# Patient Record
Sex: Female | Born: 1955
Health system: Southern US, Community
[De-identification: ages and names within clinical notes are randomized; demographics above are authoritative.]

## PROBLEM LIST (undated history)

## (undated) DIAGNOSIS — R7303 Prediabetes: Secondary | ICD-10-CM

## (undated) DIAGNOSIS — R87619 Unspecified abnormal cytological findings in specimens from cervix uteri: Secondary | ICD-10-CM

## (undated) DIAGNOSIS — N760 Acute vaginitis: Secondary | ICD-10-CM

## (undated) DIAGNOSIS — E7800 Pure hypercholesterolemia, unspecified: Secondary | ICD-10-CM

## (undated) DIAGNOSIS — N950 Postmenopausal bleeding: Secondary | ICD-10-CM

## (undated) DIAGNOSIS — IMO0002 Reserved for concepts with insufficient information to code with codable children: Secondary | ICD-10-CM

## (undated) DIAGNOSIS — B9689 Other specified bacterial agents as the cause of diseases classified elsewhere: Secondary | ICD-10-CM

## (undated) HISTORY — DX: Reserved for concepts with insufficient information to code with codable children: IMO0002

## (undated) HISTORY — DX: Other specified bacterial agents as the cause of diseases classified elsewhere: B96.89

## (undated) HISTORY — DX: Postmenopausal bleeding: N95.0

## (undated) HISTORY — DX: Acute vaginitis: N76.0

## (undated) HISTORY — DX: Unspecified abnormal cytological findings in specimens from cervix uteri: R87.619

## (undated) HISTORY — DX: Prediabetes: R73.03

---

## 1999-04-29 ENCOUNTER — Emergency Department (HOSPITAL_COMMUNITY): Admission: EM | Admit: 1999-04-29 | Discharge: 1999-04-29 | Payer: Self-pay | Admitting: Emergency Medicine

## 2000-03-10 ENCOUNTER — Other Ambulatory Visit: Admission: RE | Admit: 2000-03-10 | Discharge: 2000-03-10 | Payer: Self-pay | Admitting: Obstetrics and Gynecology

## 2000-03-11 ENCOUNTER — Encounter: Payer: Self-pay | Admitting: Obstetrics and Gynecology

## 2000-03-11 ENCOUNTER — Encounter: Admission: RE | Admit: 2000-03-11 | Discharge: 2000-03-11 | Payer: Self-pay | Admitting: Obstetrics and Gynecology

## 2001-03-14 ENCOUNTER — Encounter: Admission: RE | Admit: 2001-03-14 | Discharge: 2001-03-14 | Payer: Self-pay | Admitting: Obstetrics and Gynecology

## 2001-03-14 ENCOUNTER — Encounter: Payer: Self-pay | Admitting: Obstetrics and Gynecology

## 2002-04-04 ENCOUNTER — Encounter: Payer: Self-pay | Admitting: Obstetrics and Gynecology

## 2002-04-04 ENCOUNTER — Encounter: Admission: RE | Admit: 2002-04-04 | Discharge: 2002-04-04 | Payer: Self-pay | Admitting: Obstetrics and Gynecology

## 2005-12-05 ENCOUNTER — Emergency Department (HOSPITAL_COMMUNITY): Admission: AD | Admit: 2005-12-05 | Discharge: 2005-12-05 | Payer: Self-pay | Admitting: Family Medicine

## 2010-06-07 ENCOUNTER — Encounter: Payer: Self-pay | Admitting: Obstetrics and Gynecology

## 2011-12-14 ENCOUNTER — Telehealth: Payer: Self-pay | Admitting: Obstetrics and Gynecology

## 2011-12-14 NOTE — Telephone Encounter (Signed)
Tc to pt per telephone call. Pt c/o vaginal bleeding mainly with wiping only starting 12/10/11-present. No fever or abd pain. Pt wants to know if this is normal due to dx of menopause in 05/2011. Informed pt of vph recs per 07/07/2011 for hysteroscopy if bldg continues, pt declines at this time. Pt wants vph to let her know if she feels hysteroscopy is necessary first before proceeding. Pt is willing to have irregular bleeding on/off if this is common.  Will consult with vph and cb with recs. Pt agrees

## 2011-12-14 NOTE — Telephone Encounter (Signed)
Lm on vm to cb per telephone call.  

## 2011-12-14 NOTE — Telephone Encounter (Signed)
[name]/chart received

## 2011-12-15 NOTE — Telephone Encounter (Signed)
[name]/TRIAGE-FOLLOW UP CALL

## 2011-12-16 ENCOUNTER — Telehealth: Payer: Self-pay

## 2011-12-16 NOTE — Telephone Encounter (Signed)
Pt c/o heavy bleeding this am . Pt is having to wear to 2 pads with a tampon;changing ever 1 hours. No abd pain. Mild cramping. No fever.

## 2011-12-16 NOTE — Telephone Encounter (Signed)
Tc to pt per vph recs rgdg heavy bldg. Appt sched 12/17/11 @ 1:45 for eval. Pt agrees.

## 2011-12-17 ENCOUNTER — Ambulatory Visit (INDEPENDENT_AMBULATORY_CARE_PROVIDER_SITE_OTHER): Payer: BC Managed Care – PPO | Admitting: Obstetrics and Gynecology

## 2011-12-17 ENCOUNTER — Encounter: Payer: Self-pay | Admitting: Obstetrics and Gynecology

## 2011-12-17 VITALS — BP 132/68 | HR 72 | Temp 99.2°F

## 2011-12-17 DIAGNOSIS — N920 Excessive and frequent menstruation with regular cycle: Secondary | ICD-10-CM | POA: Insufficient documentation

## 2011-12-17 MED ORDER — MISOPROSTOL 200 MCG PO TABS: ORAL_TABLET | ORAL | Status: DC

## 2011-12-17 MED ORDER — MEDROXYPROGESTERONE ACETATE 10 MG PO TABS: ORAL_TABLET | ORAL | Status: DC

## 2011-12-17 NOTE — Progress Notes (Signed)
GYN PROBLEM VISIT  Subjective: [name] is a 56 y.o. year old female,G2P2000, who presents for a problem visit. Pt c/o vaginal bleeding mainly with wiping only starting 12/10/11-present. No fever or abd pain. Mild cramping. Pt wants to know if this is normal due to dx of menopause in 05/2011. Pt c/o increased bldg since yesterday. Pt having to wear pads and tampons for protection, and change hourly.  Objective:  BP 132/68  Pulse 72  Temp 99.2 F (37.3 C)  Normal endo bx in March   External genitalia: normal general appearance Vaginal: large amt blood in vault Cervix: normal appearance Adnexa: no masses Uterus: upper limits nl size  Assessment: Perimenopausal bleeding pattern Prior successful management with Mirena Probable continued need for contraception  Plan: Options for management discussed: A)Hysterectomy      B)Provera short term until bleeding stops then observation      C)Mirena      D)Contraceptive hormones such as BCPs or implants or injections Pt wants B) then C) Provera 40mg  daily until bleeding stops then taper to maintain amenorrhea,then insert Mirena when she returns to [place] Return to office prn for Mirena insertion.   Carol Gray,   12/17/2011 2:54 PM

## 2011-12-17 NOTE — Telephone Encounter (Signed)
Make follow up appointment

## 2011-12-17 NOTE — Telephone Encounter (Signed)
Appt sched today @1 :45 with vph for eval. Pt agrees.

## 2012-03-29 ENCOUNTER — Ambulatory Visit: Payer: BC Managed Care – PPO | Admitting: Obstetrics and Gynecology

## 2012-04-13 ENCOUNTER — Encounter: Payer: Self-pay | Admitting: Obstetrics and Gynecology

## 2012-04-13 ENCOUNTER — Ambulatory Visit (INDEPENDENT_AMBULATORY_CARE_PROVIDER_SITE_OTHER): Payer: BC Managed Care – PPO | Admitting: Obstetrics and Gynecology

## 2012-04-13 VITALS — BP 108/78 | HR 64 | Ht 71.0 in | Wt 223.0 lb

## 2012-04-13 DIAGNOSIS — N924 Excessive bleeding in the premenopausal period: Secondary | ICD-10-CM

## 2012-04-13 DIAGNOSIS — Z01419 Encounter for gynecological examination (general) (routine) without abnormal findings: Secondary | ICD-10-CM

## 2012-04-13 NOTE — Progress Notes (Deleted)
Subjective:    Carol Gray is a 56 y.o. female G2P2000 who presents for annual exam.  The patient has no complaints today.   Last Pap: *** WNL: {yes no free text:20080::"Yes"} Regular Periods:{yes no:314532} Contraception: ***  Monthly Breast exam:{yes no:314532} Tetanus<27yrs:{yes no:314532} Nl.Bladder Function:{yes no:314532} Daily BMs:{yes no:314532} Healthy Diet:{yes no:314532} Calcium:{yes no:314532} Mammogram:{yes no:314532} Date of Mammogram: *** Exercise:{YES NO:22349} Have often Exercise: *** Seatbelt: {yes no:314532} Abuse at home: {yes no:314532} Stressful work:{yes RU:045409} Sigmoid-colonoscopy: *** Bone Density: {EXAM; YES/NO:19492::"No"} PCP: *** Change in PMH: *** Change in [facility]:***  The following portions of the patient's history were reviewed and updated as appropriate: allergies, current medications, past family history, past medical history, past social history, past surgical history and problem list.  Review of Systems {ros; complete:30496} Gastrointestinal:No change in bowel habits, no abdominal pain, no rectal bleeding Genitourinary:negative for dysuria, frequency, hematuria, nocturia and urinary incontinence    Objective:     BP 108/78  Pulse 64  Ht 5\' 11"  (1.803 m)  Wt 223 lb (101.152 kg)  BMI 31.10 kg/m2  Weight:  Wt Readings from Last 1 Encounters:  04/13/12 223 lb (101.152 kg)     BMI: Body mass index is 31.10 kg/(m^2). General Appearance: Alert, appropriate appearance for age. No acute distress HEENT: Grossly normal Neck / Thyroid: Supple, no masses, nodes or enlargement Lungs: clear to auscultation bilaterally Back: No CVA tenderness Breast Exam: {exam; breast:30839::"No masses or nodes.No dimpling, nipple retraction or discharge."} Cardiovascular: Regular rate and rhythm. S1, S2, no murmur Gastrointestinal: Soft, non-tender, no masses or organomegaly Pelvic Exam: {Exam; pelvic:30843} Rectovaginal: {rectal female:311646::"not  indicated"} Lymphatic Exam: Non-palpable nodes in neck, clavicular, axillary, or inguinal regions Skin: no rash or abnormalities Neurologic: Normal gait and speech, no tremor  Psychiatric: Alert and oriented, appropriate affect.    Urinalysis:{Findings; lab urinalysis:10535::"Not done"}    Assessment:    {gyn exam dx:13148}    Plan:   {gyn plan:315269::"mammogram","pap smear","return annually or prn"}    Dierdre Forth MD

## 2012-04-13 NOTE — Progress Notes (Signed)
Subjective:  Last Pap: 03/13/10 WNL: Yes Regular Periods:no Contraception: post menopausal  Monthly Breast exam:no sometimes Tetanus<23yrs:yes Nl.Bladder Function:yes Daily BMs:yes Healthy Diet:no Calcium:no Mammogram:yes Date of Mammogram: 01/29/12 Exercise:yes Have often Exercise: 3 times per week  Seatbelt: yes Abuse at home: no Stressful work:yes Sigmoid-colonoscopy: 3 years ago per pt wnl  Bone Density: Yes, 2011 per pt PCP: Dr. Dorothyann Peng  Change in PMH: none  Change in [facility]: none   Carol Gray is a 56 y.o. female G2P2002 who presents for annual exam.  The patient has no complaints today.No further bleeding since episode in 12/2011 after using Provera 40 mg for 3 weeks.No real problem with hot flashes to warrant medication.  The following portions of the patient's history were reviewed and updated as appropriate: allergies, current medications, past family history, past medical history, past social history, past surgical history and problem list.  Review of Systems Pertinent items are noted in HPI. Gastrointestinal:No change in bowel habits, no abdominal pain, no rectal bleeding Genitourinary:negative for dysuria, frequency, hematuria, nocturia and urinary incontinence    Objective:     BP 108/78  Pulse 64  Ht 5\' 11"  (1.803 m)  Wt 223 lb (101.152 kg)  BMI 31.10 kg/m2  Weight:  Wt Readings from Last 1 Encounters:  04/13/12 223 lb (101.152 kg)     BMI: Body mass index is 31.10 kg/(m^2). General Appearance: Alert, appropriate appearance for age. No acute distress HEENT: Grossly normal Neck / Thyroid: Supple, no masses, nodes or enlargement Lungs: clear to auscultation bilaterally Back: No CVA tenderness Breast Exam: No masses or nodes.No dimpling, nipple retraction or discharge. Cardiovascular: Regular rate and rhythm. S1, S2, no murmur Gastrointestinal: Soft, non-tender, no masses or organomegaly Pelvic Exam: Vulva and vagina appear normal. Bimanual exam  reveals normal uterus and adnexa. Rectovaginal: normal rectal, no masses Lymphatic Exam: Non-palpable nodes in neck, clavicular, axillary, or inguinal regions Skin: no rash or abnormalities Neurologic: Normal gait and speech, no tremor  Psychiatric: Alert and oriented, appropriate affect.    Urinalysis:Not done    Assessment:    Normal gyn exam Perimenopausal bleeding pattern, now resolved    Plan:   mammogram pap smear due 2014 return annually or prn any vaginal bleeding    Dierdre Forth MD

## 2014-03-19 ENCOUNTER — Encounter: Payer: Self-pay | Admitting: Obstetrics and Gynecology

## 2014-07-08 ENCOUNTER — Emergency Department (INDEPENDENT_AMBULATORY_CARE_PROVIDER_SITE_OTHER): Payer: BLUE CROSS/BLUE SHIELD

## 2014-07-08 ENCOUNTER — Encounter (HOSPITAL_COMMUNITY): Payer: Self-pay | Admitting: *Deleted

## 2014-07-08 ENCOUNTER — Emergency Department (INDEPENDENT_AMBULATORY_CARE_PROVIDER_SITE_OTHER)
Admission: EM | Admit: 2014-07-08 | Discharge: 2014-07-08 | Disposition: A | Discharge status: Home / Self Care | Payer: Worker's Compensation | Source: Home / Self Care | Attending: Family Medicine | Admitting: Family Medicine

## 2014-07-08 DIAGNOSIS — S92911A Unspecified fracture of right toe(s), initial encounter for closed fracture: Secondary | ICD-10-CM

## 2014-07-08 NOTE — ED Provider Notes (Signed)
CSN: [id number]     Arrival date & time 07/08/14  1101 History   First MD Initiated Contact with Patient 07/08/14 1148     Chief Complaint  Patient presents with  . Foot Injury   (Consider location/radiation/quality/duration/timing/severity/associated sxs/prior Treatment) Patient is a 59 y.o. female presenting with foot injury. The history is provided by the patient.  Foot Injury Location:  Toe Time since incident:  1 week Injury: yes   Mechanism of injury: fall   Mechanism of injury comment:  In [place] tripped on plastic on floor. Fall:    Entrapped after fall: no   Toe location:  R big toe and R second toe Pain details:    Severity:  Moderate   Progression:  Unchanged Chronicity:  New Dislocation: no   Prior injury to area:  No Associated symptoms: no numbness, no stiffness, no swelling and no tingling   Associated symptoms comment:  Assoc bruising to right hip and elbow. Risk factors: obesity     Past Medical History  Diagnosis Date  . PMB (postmenopausal bleeding)   . BV (bacterial vaginosis)     h/o  . Abnormal Pap smear    History reviewed. No pertinent past surgical history. Family History  Problem Relation Age of Onset  . Diabetes Maternal Grandmother   . Cancer Mother     uterine  . Hypertension Maternal Uncle   . Diabetes Maternal Uncle   . Diabetes Maternal Aunt   . Cancer Maternal Aunt     ovarian   History  Substance Use Topics  . Smoking status: Never Smoker   . Smokeless tobacco: Never Used  . Alcohol Use: No   OB History    Gravida Para Term Preterm AB TAB SAB Ectopic Multiple Living   0 0 0 0 0 0 2     Review of Systems  Constitutional: Negative.   Musculoskeletal: Positive for joint swelling and gait problem. Negative for stiffness.    Allergies  Review of patient's allergies indicates no known allergies.  Home Medications   Prior to Admission medications   Medication Sig Start Date End Date Taking? Authorizing Provider    atorvastatin (LIPITOR) 40 MG tablet Take 40 mg by mouth daily.    Historical Provider, MD  medroxyPROGESTERone (PROVERA) 10 MG tablet Pt to take 4 tablets(40mg  total) as directed 12/17/11   Hal Morales, MD  misoprostol (CYTOTEC) 200 MCG tablet PT TO INSERT 1 TABLET IN VAGINA 12 HOURS PRIOR TO PROCEDURE;THEN INSERT 1 TABLET 6 HOURS PRIOR TO PROCDURE 12/17/11   Hal Morales, MD  sertraline (ZOLOFT) 50 MG tablet Take 50 mg by mouth daily.    Historical Provider, MD  Vitamin D, Ergocalciferol, (DRISDOL) 50000 UNITS CAPS Take 50,000 Units by mouth 2 (two) times a week.    Historical Provider, MD   BP 118/84 mmHg  Pulse 70  Temp(Src) 98.3 F (36.8 C) (Oral)  Resp 16  SpO2 98% Physical Exam  Constitutional: She is oriented to person, place, and time. She appears well-developed and well-nourished.  Musculoskeletal: She exhibits tenderness.       Feet:  Neurological: She is alert and oriented to person, place, and time.  Skin: Skin is warm and dry.  Nursing note and vitals reviewed.   ED Course  Procedures (including critical care time) Labs Review Labs Reviewed - No data to display  Imaging Review Dg Foot Complete Right  07/08/2014   CLINICAL DATA:  Right foot pain  EXAM:  RIGHT FOOT COMPLETE - 3+ VIEW  COMPARISON:  None.  FINDINGS: There is a fracture involving the tuft of the first distal phalanx. The fracture fragments are nondisplaced. No additional fractures or dislocations identified.  IMPRESSION: Acute fracture involves the tuft of the first distal phalanx.   Electronically Signed   By: Signa Kellaylor  Stroud M.D.   On: 07/08/2014 13:01   X-rays reviewed and report per radiologist.   MDM      Linna HoffJames D Kindl, MD 07/08/14 1320

## 2014-07-08 NOTE — ED Notes (Signed)
Pt  Reports  She  Fell  6  Days  Ago  [place]   She  Has  Pain  And  Bruising to  r  Hip and  r  Elbow  And  Pain  And  Swelling             She  Reports  Has  Pain in  Her  Big  Toe        Especially  When  She  Bears  Weight on it

## 2014-07-08 NOTE — Discharge Instructions (Signed)
Wear shoe as needed for comfort, activity as tolerated.

## 2014-07-23 ENCOUNTER — Emergency Department (INDEPENDENT_AMBULATORY_CARE_PROVIDER_SITE_OTHER)
Admission: EM | Admit: 2014-07-23 | Discharge: 2014-07-23 | Disposition: A | Discharge status: Home / Self Care | Payer: Worker's Compensation | Source: Home / Self Care | Attending: Emergency Medicine | Admitting: Emergency Medicine

## 2014-07-23 ENCOUNTER — Encounter (HOSPITAL_COMMUNITY): Payer: Self-pay | Admitting: Emergency Medicine

## 2014-07-23 DIAGNOSIS — S92911A Unspecified fracture of right toe(s), initial encounter for closed fracture: Secondary | ICD-10-CM

## 2014-07-23 NOTE — ED Notes (Signed)
Patient presents today for follow-up on right foot fracture of her great toe. Patient reports toe is swollen and numb and also second toe is now swollen and has numbness. Patient is in NAD.

## 2014-07-23 NOTE — ED Provider Notes (Signed)
Chief Complaint   Follow-up   History of Present Illness   Carol Gray is a 59 year old female who returns for follow-up on a tuft fracture of her right great toe. She sustained this about 2 weeks ago when she was vacationing in [place]. This was treated with a postoperative boot. It's still tender. The tip of the toe peels numb. She also has some pain and swelling of the second toe as well. She wondered why it is not getting better.  Review of Systems   Other than as noted above, the patient denies any of the following symptoms: Systemic:  No fevers or chills. Musculoskeletal:  No joint pain or arthritis.  Neurological:  No muscular weakness, paresthesias.   PMFSH   Past medical history, family history, social history, meds, and allergies were reviewed.     Physical  Examination     Vital signs:  BP 132/84 mmHg  Pulse 67  Temp(Src) 98.5 F (36.9 C) (Oral)  Resp 20  SpO2 98% Gen:  Alert and oriented times 3.  In no distress. Musculoskeletal:  Exam of the foot reveals the tip of the right great toe was tender to palpation. There is no obvious deformity. There does appear to be a large subungual hematoma. There is no evidence of infection. The second toe was also tender to palpation and swollen but less so than the great toe.  Otherwise, all joints had a full a ROM with no swelling, bruising or deformity.  No edema, pulses full. Extremities were warm and pink.  Capillary refill was brisk.  Skin:  Clear, warm and dry.  No rash. Neuro:  Alert and oriented times 3.  Muscle strength was normal.  Sensation was intact to light touch.   Radiology   Dg Foot Complete Right  07/08/2014   CLINICAL DATA:  Right foot pain  EXAM: RIGHT FOOT COMPLETE - 3+ VIEW  COMPARISON:  None.  FINDINGS: There is a fracture involving the tuft of the first distal phalanx. The fracture fragments are nondisplaced. No additional fractures or dislocations identified.  IMPRESSION: Acute fracture involves  the tuft of the first distal phalanx.   Electronically Signed   By: Signa Kell M.D.   On: 07/08/2014 13:01    I reviewed the images independently and personally and concur with the radiologist's findings.   Course in Urgent Care Center   The patient was instructed in how to buddy tape the toe.  Assessment   The encounter diagnosis was Toe fracture, right, closed, initial encounter.  I told her it would take about 6 weeks for the toe to heal up completely. It has not healed up in another month return again for recheck. She may lose the toenail. In the meantime I want her to wear the postoperative boot whenever she is up and about and keep the toe buddy taped.  Plan    1.  Meds:  The following meds were prescribed:   Discharge Medication List as of 07/23/2014  8:26 PM      2.  Patient Education/Counseling:  The patient was given appropriate handouts, self care instructions, and instructed in symptomatic relief including rest and activity, elevation, application of ice and compression.    3.  Follow up:  The patient was told to follow up here if no better in one month, or sooner if becoming worse in any way, and given some red flag symptoms such as worsening pain or neurological symptoms which would prompt immediate return.  Reuben Likesavid C Mellonie Guess, MD 07/23/14 [phone]

## 2014-07-23 NOTE — Discharge Instructions (Signed)
Will take 6 weeks total to heal.  Wear Post-op boot and buddy tape until then.  May remove to bathe and shower and at night.  If not  Healed in 6 weeks total, return for a recheck.

## 2015-01-04 ENCOUNTER — Encounter: Payer: Self-pay | Admitting: Obstetrics and Gynecology

## 2016-02-13 DIAGNOSIS — N925 Other specified irregular menstruation: Secondary | ICD-10-CM | POA: Diagnosis not present

## 2016-02-13 DIAGNOSIS — Z01411 Encounter for gynecological examination (general) (routine) with abnormal findings: Secondary | ICD-10-CM | POA: Diagnosis not present

## 2016-02-13 DIAGNOSIS — D259 Leiomyoma of uterus, unspecified: Secondary | ICD-10-CM | POA: Diagnosis not present

## 2016-02-22 IMAGING — DX DG FOOT COMPLETE 3+V*R*
3 series · 3 of 3 positions shown · non-contrast
Comparison: None.

CLINICAL DATA: Right foot pain

EXAM:
RIGHT FOOT COMPLETE - 3+ VIEW

[foot ap]
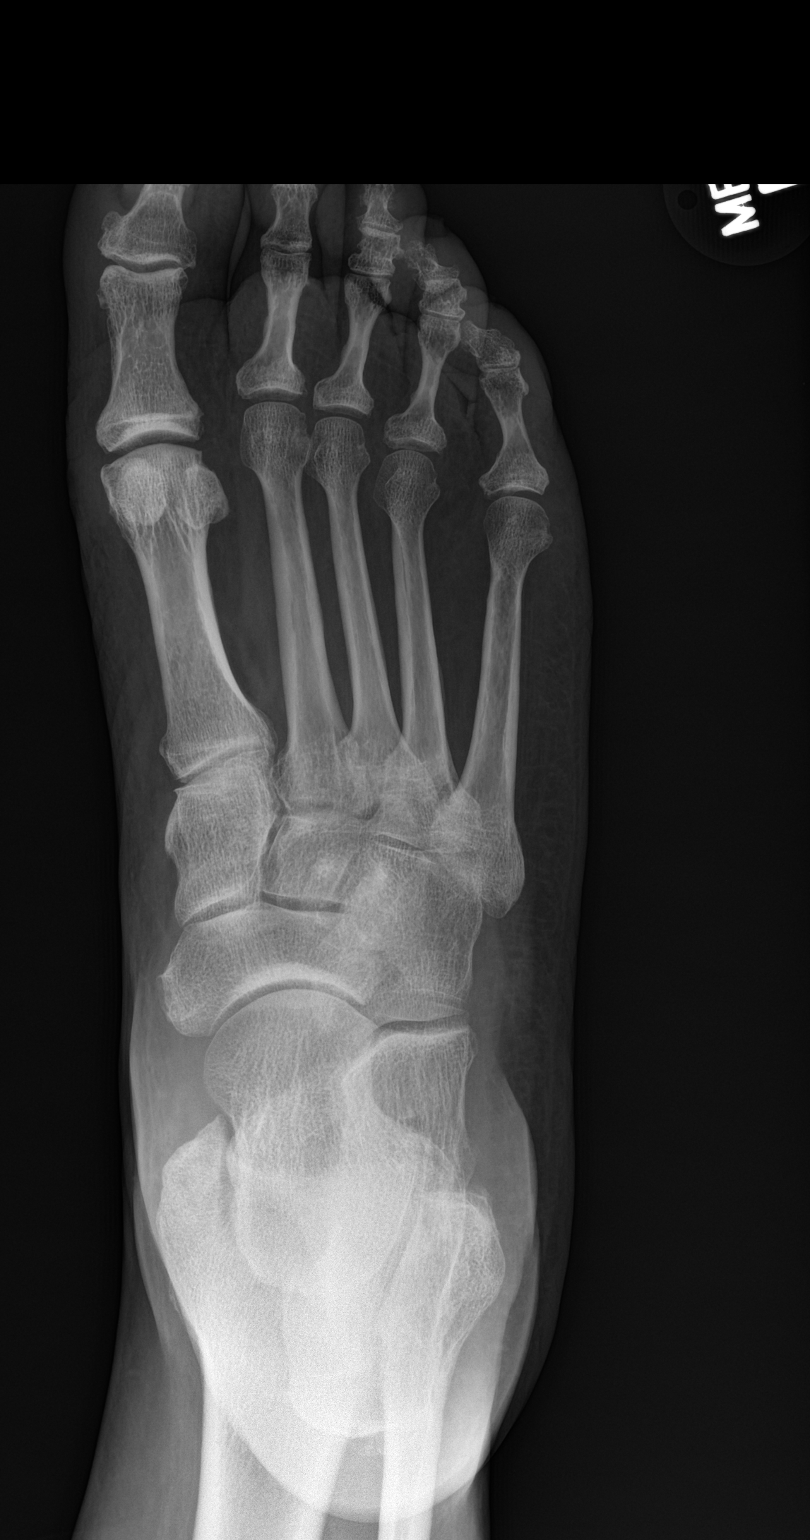

[foot obl]
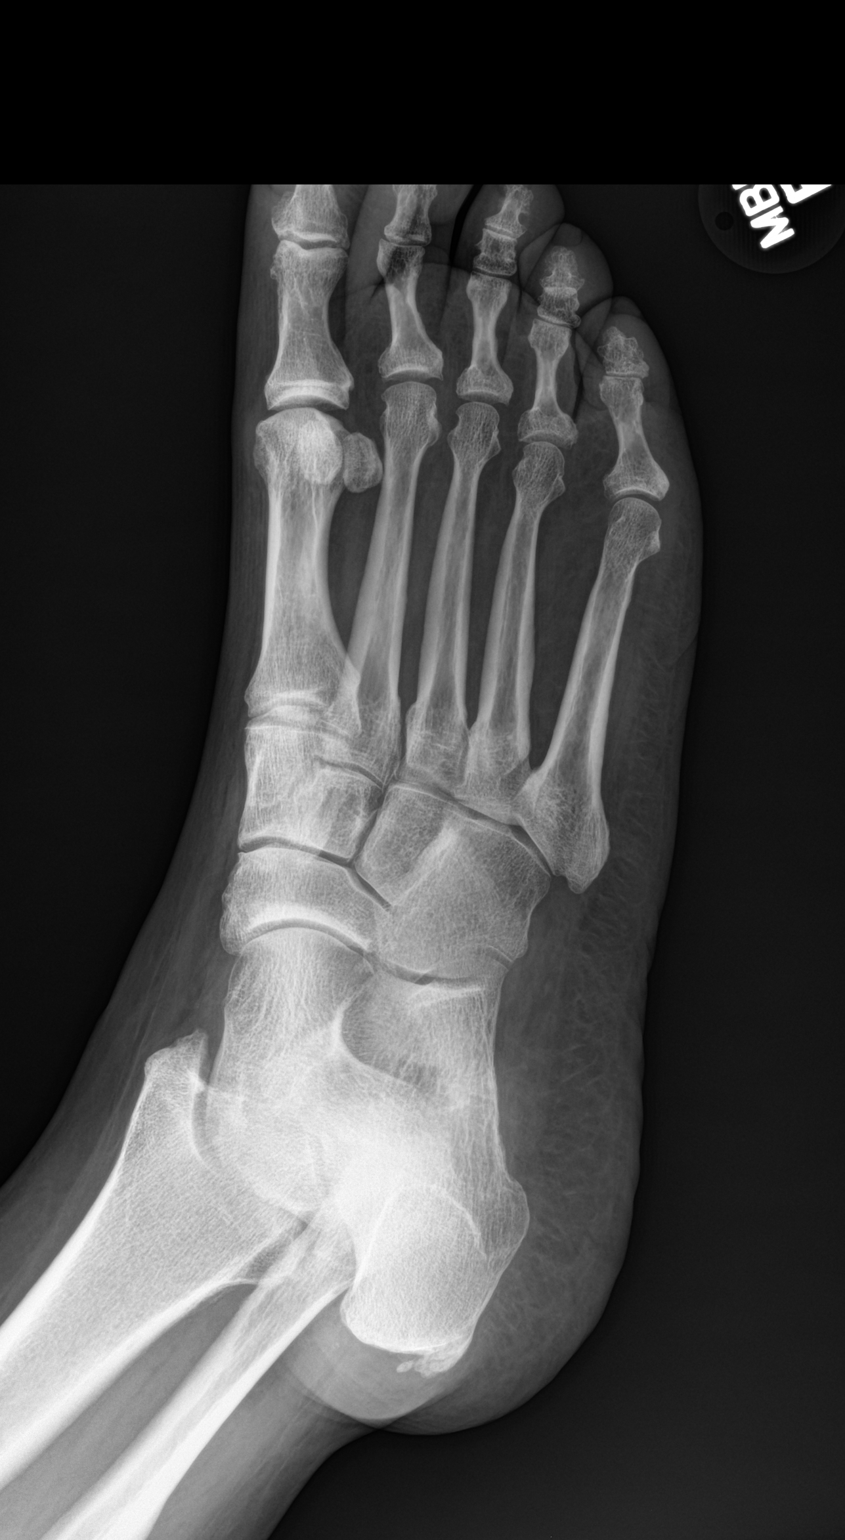

[foot lat]
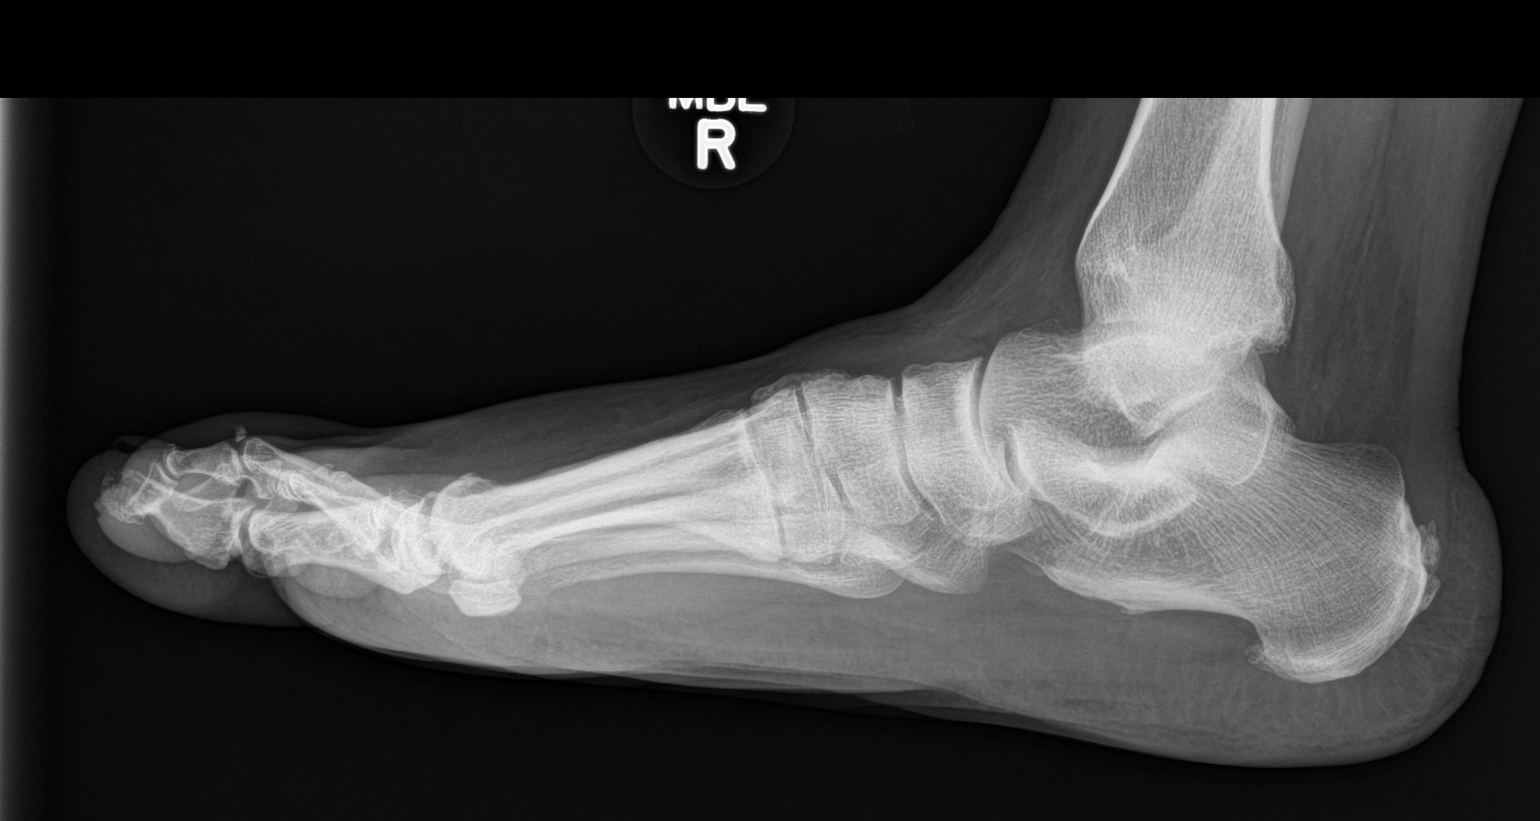

[3 of 3 positions shown; findings below may reference images not displayed]

FINDINGS: There is a fracture involving the tuft of the first distal phalanx.
The fracture fragments are nondisplaced. No additional fractures or
dislocations identified.
IMPRESSION: Acute fracture involves the tuft of the first distal phalanx.

## 2016-02-24 DIAGNOSIS — H5203 Hypermetropia, bilateral: Secondary | ICD-10-CM | POA: Diagnosis not present

## 2016-03-23 DIAGNOSIS — Z1231 Encounter for screening mammogram for malignant neoplasm of breast: Secondary | ICD-10-CM | POA: Diagnosis not present

## 2016-06-08 ENCOUNTER — Encounter (HOSPITAL_BASED_OUTPATIENT_CLINIC_OR_DEPARTMENT_OTHER): Payer: Self-pay

## 2016-06-08 ENCOUNTER — Emergency Department (HOSPITAL_BASED_OUTPATIENT_CLINIC_OR_DEPARTMENT_OTHER)
Admission: EM | Admit: 2016-06-08 | Discharge: 2016-06-08 | Disposition: A | Discharge status: Home / Self Care | Payer: BLUE CROSS/BLUE SHIELD | Attending: Emergency Medicine | Admitting: Emergency Medicine

## 2016-06-08 DIAGNOSIS — R109 Unspecified abdominal pain: Secondary | ICD-10-CM | POA: Diagnosis not present

## 2016-06-08 DIAGNOSIS — J111 Influenza due to unidentified influenza virus with other respiratory manifestations: Secondary | ICD-10-CM | POA: Diagnosis not present

## 2016-06-08 DIAGNOSIS — R51 Headache: Secondary | ICD-10-CM | POA: Diagnosis present

## 2016-06-08 DIAGNOSIS — R69 Illness, unspecified: Secondary | ICD-10-CM

## 2016-06-08 DIAGNOSIS — J029 Acute pharyngitis, unspecified: Secondary | ICD-10-CM | POA: Diagnosis not present

## 2016-06-08 MED ORDER — ONDANSETRON 4 MG PO TBDP
4.0000 mg | ORAL_TABLET | Freq: Three times a day (TID) | ORAL | 0 refills | Status: DC | PRN
Start: 2016-06-08 — End: 2019-09-05

## 2016-06-08 MED ORDER — OSELTAMIVIR PHOSPHATE 75 MG PO CAPS: 75.0000 mg | ORAL_CAPSULE | Freq: Two times a day (BID) | ORAL | 0 refills | Status: DC

## 2016-06-08 MED ORDER — IBUPROFEN 800 MG PO TABS
800.0000 mg | ORAL_TABLET | Freq: Once | ORAL | Status: AC
  Administered 2016-06-08: 800 mg via ORAL
  Filled 2016-06-08: qty 1

## 2016-06-08 NOTE — ED Notes (Signed)
Pt given Rx x 2 for tamiflu and zofran. Note for work given

## 2016-06-08 NOTE — ED Provider Notes (Signed)
[facility] Provider Note   CSN: [id number] Arrival date & time: 06/08/16  2130  By signing my name below, I, Vista Mink, attest that this documentation has been prepared under the direction and in the presence of Gwyneth Sprout, MD. Electronically signed, Vista Mink, ED Scribe. 06/08/16. 7:11 PM.  History   Chief Complaint Chief Complaint  Patient presents with  . Chills    HPI HPI Comments: Carol Gray is a 61 y.o. female who presents to the Emergency Department complaining of persistent chills, headache, sore throat, cough that started three days ago. She also notes intermittent abdominal cramping. Pt reports a non-productive cough. 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 She states that she has received sick notes from children with similar symptoms. She has not taken anything for her symptoms. No shortness of breath.  The history is provided by the patient. No language interpreter was used.    Past Medical History:  Diagnosis Date  . Abnormal Pap smear   . BV (bacterial vaginosis)    h/o  . PMB (postmenopausal bleeding)     Patient Active Problem List   Diagnosis Date Noted  . Menorrhagia 12/17/2011    History reviewed. No pertinent surgical history.  OB History    Gravida Para Term Preterm AB Living   2 2 2  0 0 2   SAB TAB Ectopic Multiple Live Births   0 0 0 0         Home Medications    Prior to Admission medications   Medication Sig Start Date End Date Taking? Authorizing Provider  atorvastatin (LIPITOR) 40 MG tablet Take 40 mg by mouth daily.    Historical Provider, MD   Family History Family History  Problem Relation Age of Onset  . Cancer Mother     uterine  . Hypertension Maternal Uncle   . Diabetes Maternal Uncle   . Diabetes Maternal Aunt   . Cancer Maternal Aunt     ovarian  . Diabetes Maternal Grandmother     Social History Social History  Substance Use Topics  . Smoking  status: Never Smoker  . Smokeless tobacco: Never Used  . Alcohol use No   Allergies   Patient has no known allergies.  Review of Systems Review of Systems  Constitutional: Positive for chills, fatigue and fever.  HENT: Positive for sore throat.   Respiratory: Positive for cough. Negative for shortness of breath.   All other systems reviewed and are negative.  Physical Exam Updated Vital Signs BP 117/74 (BP Location: Left Arm)   Pulse 88   Temp 99.1 F (37.3 C) (Oral)   Resp 20   SpO2 97%   Physical Exam  Constitutional: She is oriented to person, place, and time. She appears well-developed and well-nourished.  HENT:  Head: Normocephalic and atraumatic.  Eyes: Conjunctivae are normal.  Neck: Neck supple.  Cardiovascular: Normal rate and regular rhythm.   Pulmonary/Chest: Effort normal and breath sounds normal.  Abdominal: Soft. Bowel sounds are normal.  Musculoskeletal: Normal range of motion.  Neurological: She is alert and oriented to person, place, and time.  Skin: Skin is warm and dry.  Psychiatric: She has a normal mood and affect. Her behavior is normal.  Nursing note and vitals reviewed.  ED Treatments / Results  DIAGNOSTIC STUDIES: Oxygen Saturation is 97% on RA, normal by my interpretation.  COORDINATION OF CARE: 7:09 PM-Discussed treatment plan with pt at bedside and pt agreed  to plan.    Labs (all labs ordered are listed, but only abnormal results are displayed) Labs Reviewed - No data to display  EKG  EKG Interpretation None       Radiology No results found.  Procedures Procedures (including critical care time)  Medications Ordered in ED Medications  ibuprofen (ADVIL,MOTRIN) tablet 800 mg (800 mg Oral Given 06/08/16 1625)   Initial Impression / Assessment and Plan / ED Course  I have reviewed the triage vital signs and the nursing notes.  Pertinent labs & imaging results that were available during my care of the patient were reviewed by  me and considered in my medical decision making (see chart for details).    Pt with symptoms consistent with influenza.  Normal exam here but is febrile.  No signs of breathing difficulty  No signs of strep pharyngitis, otitis or abnormal abdominal findings.   Will continue antipyretica and rest and fluids and return for any further problems.   Final Clinical Impressions(s) / ED Diagnoses   Final diagnoses:  Influenza-like illness   New Prescriptions Discharge Medication List as of 06/08/2016  7:11 PM    START taking these medications   Details  ondansetron (ZOFRAN ODT) 4 MG disintegrating tablet Take 1 tablet (4 mg total) by mouth every 8 (eight) hours as needed for nausea or vomiting., Starting Mon 06/08/2016, Print    oseltamivir (TAMIFLU) 75 MG capsule Take 1 capsule (75 mg total) by mouth every 12 (twelve) hours., Starting Mon 06/08/2016, Print       I personally performed the services described in this documentation, which was scribed in my presence.  The recorded information has been reviewed and considered.     Gwyneth SproutWhitney Chadric Kimberley, MD 06/08/16 [phone]

## 2016-06-08 NOTE — ED Triage Notes (Signed)
C/o chills, HA, sore throat cough x 3 days-NAD-steady gait

## 2016-08-13 DIAGNOSIS — R42 Dizziness and giddiness: Secondary | ICD-10-CM | POA: Diagnosis not present

## 2016-08-20 DIAGNOSIS — R42 Dizziness and giddiness: Secondary | ICD-10-CM | POA: Diagnosis not present

## 2016-08-20 DIAGNOSIS — R202 Paresthesia of skin: Secondary | ICD-10-CM | POA: Diagnosis not present

## 2016-08-24 ENCOUNTER — Emergency Department (HOSPITAL_COMMUNITY)
Admission: EM | Admit: 2016-08-24 | Discharge: 2016-08-24 | Disposition: A | Discharge status: Home / Self Care | Payer: BLUE CROSS/BLUE SHIELD | Attending: Emergency Medicine | Admitting: Emergency Medicine

## 2016-08-24 ENCOUNTER — Emergency Department (HOSPITAL_COMMUNITY): Payer: BLUE CROSS/BLUE SHIELD

## 2016-08-24 ENCOUNTER — Encounter (HOSPITAL_COMMUNITY): Payer: Self-pay | Admitting: *Deleted

## 2016-08-24 ENCOUNTER — Other Ambulatory Visit: Payer: Self-pay

## 2016-08-24 DIAGNOSIS — Z79899 Other long term (current) drug therapy: Secondary | ICD-10-CM | POA: Insufficient documentation

## 2016-08-24 DIAGNOSIS — R42 Dizziness and giddiness: Secondary | ICD-10-CM | POA: Diagnosis not present

## 2016-08-24 HISTORY — DX: Pure hypercholesterolemia, unspecified: E78.00

## 2016-08-24 LAB — URINALYSIS, ROUTINE W REFLEX MICROSCOPIC
BILIRUBIN URINE: NEGATIVE
Glucose, UA: NEGATIVE mg/dL
HGB URINE DIPSTICK: NEGATIVE
KETONES UR: NEGATIVE mg/dL
Leukocytes, UA: NEGATIVE
Nitrite: NEGATIVE
Protein, ur: NEGATIVE mg/dL
Specific Gravity, Urine: 1.008 (ref 1.005–1.030)
pH: 7 (ref 5.0–8.0)

## 2016-08-24 LAB — BASIC METABOLIC PANEL
Anion gap: 10 (ref 5–15)
BUN: 9 mg/dL (ref 6–20)
CO2: 25 mmol/L (ref 22–32)
Calcium: 10.1 mg/dL (ref 8.9–10.3)
Chloride: 105 mmol/L (ref 101–111)
Creatinine, Ser: 0.84 mg/dL (ref 0.44–1.00)
GFR calc non Af Amer: >60 mL/min (ref >60)
Glucose, Bld: 93 mg/dL (ref 65–99)
Potassium: 4.3 mmol/L (ref 3.5–5.1)
Sodium: 140 mmol/L (ref 135–145)

## 2016-08-24 LAB — CBC
HCT: 40.6 % (ref 36.0–46.0)
Hemoglobin: 13.7 g/dL (ref 12.0–15.0)
MCH: 29.8 pg (ref 26.0–34.0)
MCHC: 33.7 g/dL (ref 30.0–36.0)
MCV: 88.5 fL (ref 78.0–100.0)
Platelets: 200 10*3/uL (ref 150–400)
RBC: 4.59 MIL/uL (ref 3.87–5.11)
RDW: 13 % (ref 11.5–15.5)
WBC: 5 10*3/uL (ref 4.0–10.5)

## 2016-08-24 NOTE — Discharge Instructions (Signed)
As discussed, your evaluation today has been largely reassuring.  But, it is important that you monitor your condition carefully, and do not hesitate to return to the ED if you develop new, or concerning changes in your condition. ? ?Otherwise, please follow-up with your physician for appropriate ongoing care. ? ?

## 2016-08-24 NOTE — ED Provider Notes (Signed)
[facility] DEPT Provider Note   CSN: [id number] Arrival date & time: 08/24/16  1215     History   Chief Complaint Chief Complaint  Patient presents with  . Dizziness    HPI Carol Gray is a 61 y.o. female.  HPI Patient presents with concern of dizziness, lightheadedness.  Onset was gradual, about one week ago, and since onset symptoms of been worsening. Patient notes that over the past week she has had frequent episodes of brief lightheadedness, near syncope, without associated chest pain. There is new paresthesia in the fingertips, bilateral feet. No recent medication changes, diet changes, activity changes. Patient works in [employer]. Patient does not smoke, does not drink. Patient went to urgent care 3 days ago, had labs, results unavailable. She was prescribed meclizine, but notes that after a few doses, she stopped taking the medication as it did not seem to change her symptoms.  She notes that with persistency of her condition she is concerned. She is here with her companion who assists with the history of present illness.     Past Medical History:  Diagnosis Date  . Abnormal Pap smear   . BV (bacterial vaginosis)    h/o  . Hypercholesteremia   . PMB (postmenopausal bleeding)     Patient Active Problem List   Diagnosis Date Noted  . Menorrhagia 12/17/2011    History reviewed. No pertinent surgical history.  OB History    Gravida Para Term Preterm AB Living   0 0 2   SAB TAB Ectopic Multiple Live Births   0 0 0 0         Home Medications    Prior to Admission medications   Medication Sig Start Date End Date Taking? Authorizing Provider  atorvastatin (LIPITOR) 40 MG tablet Take 40 mg by mouth daily.   Yes Historical Provider, MD  meclizine (ANTIVERT) 25 MG tablet Take 25 mg by mouth every 6 (six) hours as needed for dizziness. 08/13/16  Yes Historical Provider, MD  ondansetron (ZOFRAN ODT) 4 MG disintegrating tablet Take 1 tablet (4 mg  total) by mouth every 8 (eight) hours as needed for nausea or vomiting. Patient not taking: Reported on 08/24/2016 06/08/16   Gwyneth Sprout, MD  oseltamivir (TAMIFLU) 75 MG capsule Take 1 capsule (75 mg total) by mouth every 12 (twelve) hours. Patient not taking: Reported on 08/24/2016 06/08/16   Gwyneth Sprout, MD    Family History Family History  Problem Relation Age of Onset  . Cancer Mother     uterine  . Hypertension Maternal Uncle   . Diabetes Maternal Uncle   . Diabetes Maternal Aunt   . Cancer Maternal Aunt     ovarian  . Diabetes Maternal Grandmother     Social History Social History  Substance Use Topics  . Smoking status: Never Smoker  . Smokeless tobacco: Never Used  . Alcohol use No     Allergies   Patient has no known allergies.   Review of Systems Review of Systems  Constitutional:       Per HPI, otherwise negative  HENT:       Per HPI, otherwise negative  Respiratory:       Per HPI, otherwise negative  Cardiovascular:       Per HPI, otherwise negative  Gastrointestinal: Negative for vomiting.  Endocrine:       Negative aside from HPI  Genitourinary:       Neg aside from HPI   Musculoskeletal:  Per HPI, otherwise negative  Skin: Negative.   Neurological: Positive for dizziness and light-headedness. Negative for syncope.     Physical Exam Updated Vital Signs BP 115/70 (BP Location: Left Arm)   Pulse (!) 59   Temp 98.5 F (36.9 C) (Oral)   Resp 19   Ht 5' 10.5" (1.791 m)   Wt 223 lb (101.2 kg)   SpO2 100%   BMI 31.54 kg/m   Physical Exam  Constitutional: She is oriented to person, place, and time. She appears well-developed and well-nourished. No distress.  HENT:  Head: Normocephalic and atraumatic.  Eyes: Conjunctivae and EOM are normal.  Cardiovascular: Normal rate and regular rhythm.   Pulmonary/Chest: Effort normal and breath sounds normal. No stridor. No respiratory distress.  Abdominal: She exhibits no distension.    Musculoskeletal: She exhibits no edema.  Neurological: She is alert and oriented to person, place, and time. She displays no atrophy and no tremor. No cranial nerve deficit or sensory deficit. She exhibits normal muscle tone. She displays no seizure activity.  Skin: Skin is warm and dry.  Psychiatric: She has a normal mood and affect.  Nursing note and vitals reviewed.    ED Treatments / Results  Labs (all labs ordered are listed, but only abnormal results are displayed) Labs Reviewed  URINALYSIS, ROUTINE W REFLEX MICROSCOPIC - Abnormal; Notable for the following:       Result Value   Color, Urine STRAW (*)    All other components within normal limits  CBC  BASIC METABOLIC PANEL    EKG  EKG Interpretation  Date/Time:  Monday August 24 2016 12:21:52 EDT Ventricular Rate:  64 PR Interval:  142 QRS Duration: 82 QT Interval:  414 QTC Calculation: 427 R Axis:   21 Text Interpretation:  Normal sinus rhythm Nonspecific T wave abnormality Abnormal ekg Confirmed by Gerhard Munch  MD ([id number]) on 08/24/2016 7:07:52 PM       Radiology No results found.  Procedures Procedures (including critical care time)    Initial Impression / Assessment and Plan / ED Course  I have reviewed the triage vital signs and the nursing notes.  Pertinent labs & imaging results that were available during my care of the patient were reviewed by me and considered in my medical decision making (see chart for details).  Patient presents with concern of lightheadedness, dizziness. Here the patient is awake, alert, hemodynamically stable, in no distress per Patient has reassuring labs, physical exam, no evidence for CNS dysfunction. Patient's presentation is inconsistent with vertigo. Patient had outpatient labs performed a few days ago, including thyroid studies, which are not currently available. With otherwise reassuring labs, vitals, physical exam today, little evidence for stroke, infection, no  evidence for ACS, patient prepared for further evaluation, management as an outpatient, with primary care and neurology.   Final Clinical Impressions(s) / ED Diagnoses  Lightheadedness   Gerhard Munch, MD 08/24/16 2119

## 2016-08-24 NOTE — ED Notes (Signed)
Pt in tears begging me not to take her blood.  She requested we wait until the MD sees her.

## 2016-08-24 NOTE — ED Notes (Signed)
Patient transported to X-ray 

## 2016-08-24 NOTE — ED Triage Notes (Signed)
Pt states lightheadedness, off and on, ever since she had flu in Jan.  For the last few weeks, the s/s have increased to every day and now also c/o of numbness bil to hands and feet, and a tight/pulling feeling to both sides of her jaw and neck.  Blood work at MD was all negative.

## 2016-08-31 ENCOUNTER — Ambulatory Visit: Payer: Self-pay | Admitting: Diagnostic Neuroimaging

## 2016-09-14 ENCOUNTER — Encounter: Payer: Self-pay | Admitting: Diagnostic Neuroimaging

## 2016-09-14 ENCOUNTER — Ambulatory Visit (INDEPENDENT_AMBULATORY_CARE_PROVIDER_SITE_OTHER): Payer: BLUE CROSS/BLUE SHIELD | Admitting: Diagnostic Neuroimaging

## 2016-09-14 VITALS — BP 132/79 | HR 60 | Ht 70.5 in | Wt 226.6 lb

## 2016-09-14 DIAGNOSIS — R42 Dizziness and giddiness: Secondary | ICD-10-CM

## 2016-09-14 DIAGNOSIS — R200 Anesthesia of skin: Secondary | ICD-10-CM

## 2016-09-14 NOTE — Progress Notes (Signed)
[facility]  PATIENT: Carol Gray DOB: [birth year]  REFERRING CLINICIAN: ER / Jeraldine Loots HISTORY FROM: patient  REASON FOR VISIT: new consult    HISTORICAL  CHIEF COMPLAINT:  Chief Complaint  Patient presents with  . Dizziness    rm 7, New Pt, "numbness in feet/hands/face; fatigue; light headed-dizziness-mostly in evenings; blurred vision x 8 weeks"    HISTORY OF PRESENT ILLNESS:   61 year old right-handed female here for evaluation of numbness and tingling. For past 8 weeks patient has had intermittent numbness and tingling in her hands, feet, face, mouth and tongue. Symptoms last seconds at a time. They may occur once per week.  Patient also having intermittent lightheadedness episodes which she describes as intermittent balance difficulty. This may last for a few seconds at time, occur a few times per day. This has been going on for 2-3 months.  No other specific triggering or aggravating factors.  Having some intermittent runny nose, sinus congestion symptoms for past few years.   REVIEW OF SYSTEMS: Full 14 system review of systems performed and negative with exception of: Fatigue blurred vision numbness dizziness not enough sleep decreased energy allergies runny nose.  ALLERGIES: No Known Allergies  HOME MEDICATIONS: Outpatient Medications Prior to Visit  Medication Sig Dispense Refill  . atorvastatin (LIPITOR) 40 MG tablet Take 40 mg by mouth daily.    . meclizine (ANTIVERT) 25 MG tablet Take 25 mg by mouth every 6 (six) hours as needed for dizziness.  0  . ondansetron (ZOFRAN ODT) 4 MG disintegrating tablet Take 1 tablet (4 mg total) by mouth every 8 (eight) hours as needed for nausea or vomiting. (Patient not taking: Reported on 08/24/2016) 10 tablet 0  . oseltamivir (TAMIFLU) 75 MG capsule Take 1 capsule (75 mg total) by mouth every 12 (twelve) hours. (Patient not taking: Reported on 08/24/2016) 10 capsule 0   No facility-administered  medications prior to visit.     PAST MEDICAL HISTORY: Past Medical History:  Diagnosis Date  . Abnormal Pap smear   . BV (bacterial vaginosis)    h/o  . Hypercholesteremia   . PMB (postmenopausal bleeding)     PAST SURGICAL HISTORY: History reviewed. No pertinent surgical history.  FAMILY HISTORY: Family History  Problem Relation Age of Onset  . Cancer Mother     uterine  . Hypertension Maternal Uncle   . Diabetes Maternal Uncle   . Diabetes Maternal Aunt   . Cancer Maternal Aunt     ovarian  . Diabetes Maternal Grandmother   . Cancer Father     lung    SOCIAL HISTORY:  Social History   Social History  . Marital status: Married    Spouse name: Criss Alvine  . Number of children: 2  . Years of education: 13   Occupational History  .      [employer]   Social History Main Topics  . Smoking status: Never Smoker  . Smokeless tobacco: Never Used  . Alcohol use No  . Drug use: No  . Sexual activity: Not on file   Other Topics Concern  . Not on file   Social History Narrative   Lives with husband   Caffeine - occas     PHYSICAL EXAM  GENERAL EXAM/CONSTITUTIONAL: Vitals:  Vitals:   09/14/16 0853  BP: 132/79  Pulse: 60  Weight: 226 lb 9.6 oz (102.8 kg)  Height: 5' 10.5" (1.791 m)     Body mass index is 32.05 kg/m.  Visual Acuity  Screening   Right eye Left eye Both eyes  Without correction: 20/40 20/40   With correction:     Comments: 09/14/16 has distance glasses but not with her today    Patient is in no distress; well developed, nourished and groomed; neck is supple  CARDIOVASCULAR:  Examination of carotid arteries is normal; no carotid bruits  Regular rate and rhythm, no murmurs  Examination of peripheral vascular system by observation and palpation is normal  EYES:  Ophthalmoscopic exam of optic discs and posterior segments is normal; no papilledema or hemorrhages  MUSCULOSKELETAL:  Gait, strength, tone, movements noted in  Neurologic exam below  NEUROLOGIC: MENTAL STATUS:  No flowsheet data found.  awake, alert, oriented to person, place and time  recent and remote memory intact  normal attention and concentration  language fluent, comprehension intact, naming intact,   fund of knowledge appropriate  CRANIAL NERVE:   2nd - no papilledema on fundoscopic exam  2nd, 3rd, 4th, 6th - pupils equal and reactive to light, visual fields full to confrontation, extraocular muscles intact, no nystagmus  5th - facial sensation symmetric  7th - facial strength symmetric  8th - hearing intact  9th - palate elevates symmetrically, uvula midline  11th - shoulder shrug symmetric  12th - tongue protrusion midline  MOTOR:   normal bulk and tone, full strength in the BUE, BLE  SENSORY:   normal and symmetric to light touch, temperature, vibration  COORDINATION:   finger-nose-finger, fine finger movements normal  REFLEXES:   deep tendon reflexes present and symmetric  GAIT/STATION:   narrow based gait; able to walk tandem; romberg is negative    DIAGNOSTIC DATA (LABS, IMAGING, TESTING) - I reviewed patient records, labs, notes, testing and imaging myself where available.  Lab Results  Component Value Date   WBC 5.0 08/24/2016   HGB 13.7 08/24/2016   HCT 40.6 08/24/2016   MCV 88.5 08/24/2016   PLT 200 08/24/2016      Component Value Date/Time   NA 140 08/24/2016 1839   K 4.3 08/24/2016 1839   CL 105 08/24/2016 1839   CO2 25 08/24/2016 1839   GLUCOSE 93 08/24/2016 1839   BUN 9 08/24/2016 1839   CREATININE 0.84 08/24/2016 1839   CALCIUM 10.1 08/24/2016 1839   GFRNONAA >60 08/24/2016 1839   GFRAA >60 08/24/2016 1839   No results found for: CHOL, HDL, LDLCALC, LDLDIRECT, TRIG, CHOLHDL No results found for: ZOXW9U No results found for: VITAMINB12 No results found for: TSH      ASSESSMENT AND PLAN  61 y.o. year old female here with Intermittent episodes of numbness and  tingling and intermittent lightheadedness / balance difficulty attacks, each lasting few seconds at a time, for last 2-3 months. Will proceed with further workup.   Ddx: metabolic, autoimmune, inflamm, demyelinating  1. Numbness   2. Lightheaded      PLAN: - check MRI brain and labs - follow up with PCP re: allergy symptoms  Orders Placed This Encounter  Procedures  . MR BRAIN W WO CONTRAST  . TSH  . Vitamin B12  . Hemoglobin A1c   Return in about 2 months (around 11/14/2016).    Suanne Marker, MD 09/14/2016, 9:26 AM Certified in Neurology, Neurophysiology and Neuroimaging  [facility] [address], [address] [place], [place] [address] [phone]

## 2016-09-14 NOTE — Patient Instructions (Signed)
Thank you for coming to see [place] at [facility]. I hope we have been able to provide you high quality care today.  You may receive a patient satisfaction survey over the next few weeks. We would appreciate your feedback and comments so that we may continue to improve ourselves and the health of our patients.  - I will check MRI and lab testing   ~~~~~~~~~~~~~~~~~~~~~~~~~~~~~~~~~~~~~~~~~~~~~~~~~~~~~~~~~~~~~~~~~  DR. Felix Pratt'S GUIDE TO HAPPY AND HEALTHY LIVING These are some of my general health and wellness recommendations. Some of them may apply to you better than others. Please use common sense as you try these suggestions and feel free to ask me any questions.   ACTIVITY/FITNESS Mental, social, emotional and physical stimulation are very important for brain and body health. Try learning a new activity (arts, music, language, sports, games).  Keep moving your body to the best of your abilities. You can do this at home, inside or outside, the park, community center, gym or anywhere you like. Consider a physical therapist or personal trainer to get started. Consider the app Sworkit. Fitness trackers such as smart-watches, smart-phones or Fitbits can help as well.   NUTRITION Eat more plants: colorful vegetables, nuts, seeds and berries.  Eat less sugar, salt, preservatives and processed foods.  Avoid toxins such as cigarettes and alcohol.  Drink water when you are thirsty. Warm water with a slice of lemon is an excellent morning drink to start the day.  Consider these websites for more information The Nutrition Source (https://www.henry-hernandez.biz/) Precision Nutrition (WindowBlog.ch)   RELAXATION Consider practicing mindfulness meditation or other relaxation techniques such as deep breathing, prayer, yoga, tai chi, massage. See website mindful.org or the apps Headspace or Calm to help get started.   SLEEP Try to  get at least 7-8+ hours sleep per day. Regular exercise and reduced caffeine will help you sleep better. Practice good sleep hygeine techniques. See website sleep.org for more information.   PLANNING Prepare estate planning, living will, healthcare POA documents. Sometimes this is best planned with the help of an attorney. Theconversationproject.org and agingwithdignity.org are excellent resources.

## 2016-09-15 ENCOUNTER — Telehealth: Payer: Self-pay | Admitting: Diagnostic Neuroimaging

## 2016-09-15 LAB — VITAMIN B12: Vitamin B-12: 372 pg/mL (ref 232–1245)

## 2016-09-15 LAB — HEMOGLOBIN A1C
ESTIMATED AVERAGE GLUCOSE: 117 mg/dL
Hgb A1c MFr Bld: 5.7 % — ABNORMAL HIGH (ref 4.8–5.6)

## 2016-09-15 LAB — TSH: TSH: 1.6 u[IU]/mL (ref 0.450–4.500)

## 2016-09-15 NOTE — Telephone Encounter (Signed)
Patient called office in reference to scheduling MRI appointment.  Please call

## 2016-09-16 NOTE — Telephone Encounter (Signed)
I called the patient back and scheduled her MRI for 09/23/16 at our [facility] mobile unit.

## 2016-09-17 ENCOUNTER — Telehealth: Payer: Self-pay | Admitting: *Deleted

## 2016-09-17 NOTE — Telephone Encounter (Signed)
Per Dr Marjory LiesPenumalli, LVM informing patient her lab results are unremarkable. Left number for any questions.

## 2016-09-23 ENCOUNTER — Other Ambulatory Visit: Payer: BLUE CROSS/BLUE SHIELD

## 2016-11-16 ENCOUNTER — Ambulatory Visit: Payer: BLUE CROSS/BLUE SHIELD | Admitting: Diagnostic Neuroimaging

## 2017-03-01 DIAGNOSIS — D259 Leiomyoma of uterus, unspecified: Secondary | ICD-10-CM | POA: Diagnosis not present

## 2017-03-01 DIAGNOSIS — Z01411 Encounter for gynecological examination (general) (routine) with abnormal findings: Secondary | ICD-10-CM | POA: Diagnosis not present

## 2017-03-12 DIAGNOSIS — E559 Vitamin D deficiency, unspecified: Secondary | ICD-10-CM | POA: Diagnosis not present

## 2017-03-12 DIAGNOSIS — R7309 Other abnormal glucose: Secondary | ICD-10-CM | POA: Diagnosis not present

## 2017-03-12 DIAGNOSIS — R7303 Prediabetes: Secondary | ICD-10-CM | POA: Diagnosis not present

## 2017-03-12 DIAGNOSIS — E785 Hyperlipidemia, unspecified: Secondary | ICD-10-CM | POA: Diagnosis not present

## 2017-04-16 DIAGNOSIS — Z1231 Encounter for screening mammogram for malignant neoplasm of breast: Secondary | ICD-10-CM | POA: Diagnosis not present

## 2018-02-14 ENCOUNTER — Other Ambulatory Visit: Payer: Self-pay | Admitting: Nurse Practitioner

## 2018-02-14 DIAGNOSIS — R7303 Prediabetes: Secondary | ICD-10-CM

## 2018-02-14 DIAGNOSIS — E669 Obesity, unspecified: Secondary | ICD-10-CM

## 2018-02-14 DIAGNOSIS — Z0000 Encounter for general adult medical examination without abnormal findings: Secondary | ICD-10-CM

## 2018-02-14 DIAGNOSIS — E785 Hyperlipidemia, unspecified: Secondary | ICD-10-CM

## 2018-02-14 DIAGNOSIS — Z1211 Encounter for screening for malignant neoplasm of colon: Secondary | ICD-10-CM

## 2018-02-14 DIAGNOSIS — E559 Vitamin D deficiency, unspecified: Secondary | ICD-10-CM | POA: Diagnosis not present

## 2018-02-15 LAB — CBC WITH DIFFERENTIAL/PLATELET
BASOS: 0 %
Basophils Absolute: 0 10*3/uL (ref 0.0–0.2)
EOS (ABSOLUTE): 0.1 10*3/uL (ref 0.0–0.4)
Eos: 2 %
Hematocrit: 39.4 % (ref 34.0–46.6)
Hemoglobin: 12.8 g/dL (ref 11.1–15.9)
IMMATURE GRANULOCYTES: 0 %
Immature Grans (Abs): 0 10*3/uL (ref 0.0–0.1)
LYMPHS: 35 %
Lymphocytes Absolute: 1.4 10*3/uL (ref 0.7–3.1)
MCH: 28.3 pg (ref 26.6–33.0)
MCHC: 32.5 g/dL (ref 31.5–35.7)
MCV: 87 fL (ref 79–97)
Monocytes Absolute: 0.3 10*3/uL (ref 0.1–0.9)
Monocytes: 8 %
NEUTROS ABS: 2.2 10*3/uL (ref 1.4–7.0)
NEUTROS PCT: 55 %
Platelets: 242 10*3/uL (ref 150–450)
RBC: 4.52 x10E6/uL (ref 3.77–5.28)
RDW: 12.9 % (ref 12.3–15.4)
WBC: 3.9 10*3/uL (ref 3.4–10.8)

## 2018-02-15 LAB — COMPREHENSIVE METABOLIC PANEL
ALT: 18 IU/L (ref 0–32)
AST: 23 IU/L (ref 0–40)
Albumin/Globulin Ratio: 1.6 (ref 1.2–2.2)
Albumin: 4.2 g/dL (ref 3.5–5.5)
Alkaline Phosphatase: 79 IU/L (ref 39–117)
BILIRUBIN TOTAL: 1.2 mg/dL (ref 0.0–1.2)
BUN/Creatinine Ratio: 11 (ref 9–23)
BUN: 11 mg/dL (ref 6–24)
CHLORIDE: 103 mmol/L (ref 96–106)
CO2: 24 mmol/L (ref 20–29)
Calcium: 9.7 mg/dL (ref 8.7–10.2)
Creatinine, Ser: 1.01 mg/dL — ABNORMAL HIGH (ref 0.57–1.00)
GFR calc Af Amer: 70 mL/min/{1.73_m2} (ref >59)
GFR, EST NON AFRICAN AMERICAN: 61 mL/min/{1.73_m2} (ref >59)
GLOBULIN, TOTAL: 2.6 g/dL (ref 1.5–4.5)
Glucose: 95 mg/dL (ref 65–99)
Potassium: 3.9 mmol/L (ref 3.5–5.2)
Sodium: 142 mmol/L (ref 134–144)
Total Protein: 6.8 g/dL (ref 6.0–8.5)

## 2018-02-15 LAB — VITAMIN D 25 HYDROXY (VIT D DEFICIENCY, FRACTURES): Vit D, 25-Hydroxy: 29.7 ng/mL — ABNORMAL LOW (ref 30.0–100.0)

## 2018-02-15 LAB — LIPID PANEL WITH LDL/HDL RATIO
Cholesterol, Total: 141 mg/dL (ref 100–199)
HDL: 45 mg/dL (ref >39)
LDL Calculated: 80 mg/dL (ref 0–99)
LDL/HDL RATIO: 1.8 ratio (ref 0.0–3.2)
TRIGLYCERIDES: 79 mg/dL (ref 0–149)
VLDL Cholesterol Cal: 16 mg/dL (ref 5–40)

## 2018-02-15 LAB — HGB A1C W/O EAG: HEMOGLOBIN A1C: 5.9 % — AB (ref 4.8–5.6)

## 2018-03-04 DIAGNOSIS — D259 Leiomyoma of uterus, unspecified: Secondary | ICD-10-CM | POA: Diagnosis not present

## 2018-03-04 DIAGNOSIS — Z6832 Body mass index (BMI) 32.0-32.9, adult: Secondary | ICD-10-CM | POA: Diagnosis not present

## 2018-03-04 DIAGNOSIS — Z01411 Encounter for gynecological examination (general) (routine) with abnormal findings: Secondary | ICD-10-CM | POA: Diagnosis not present

## 2018-03-05 ENCOUNTER — Other Ambulatory Visit: Payer: Self-pay | Admitting: Nurse Practitioner

## 2018-04-02 ENCOUNTER — Other Ambulatory Visit: Payer: Self-pay | Admitting: Nurse Practitioner

## 2018-04-10 IMAGING — CR DG CHEST 2V
2 series · 2 of 2 positions shown · non-contrast
Comparison: None.

CLINICAL DATA: Dizziness.

EXAM:
CHEST  2 VIEW

[chest pa]
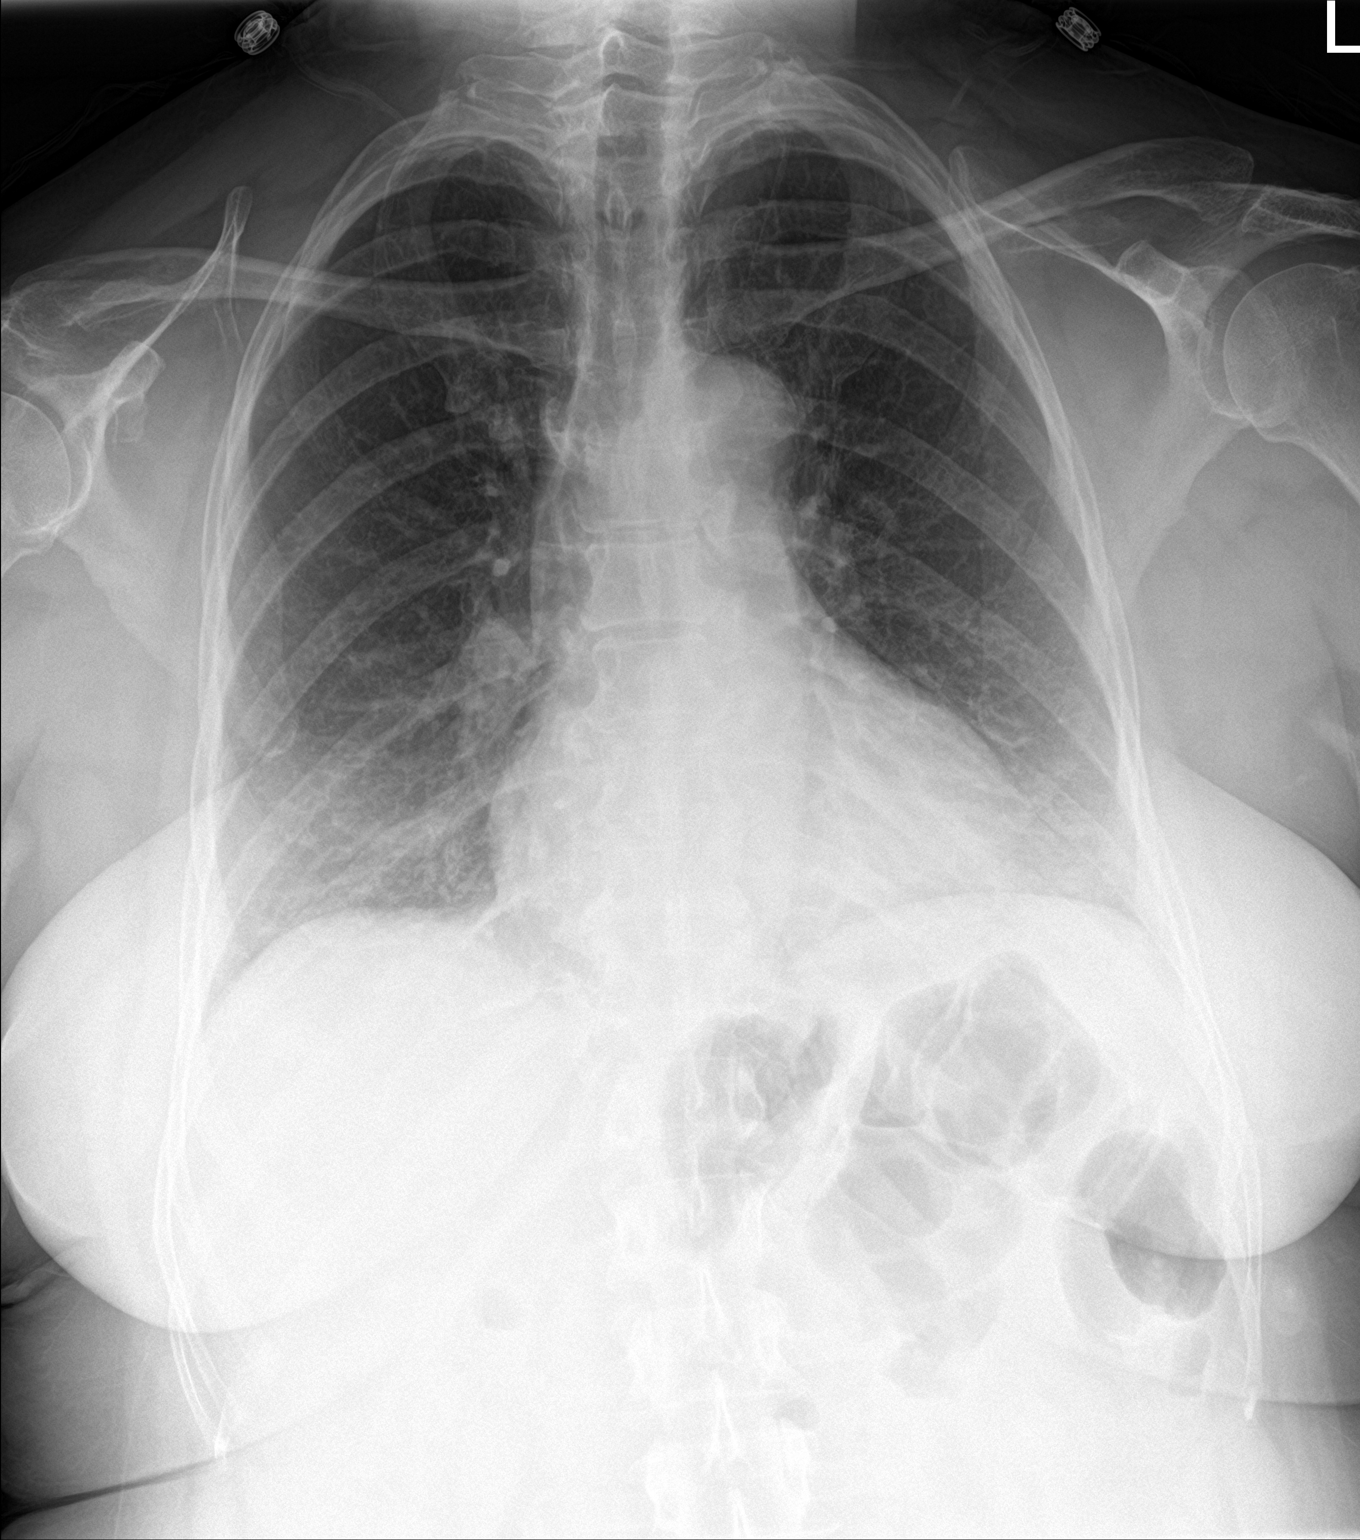

[chest lat]
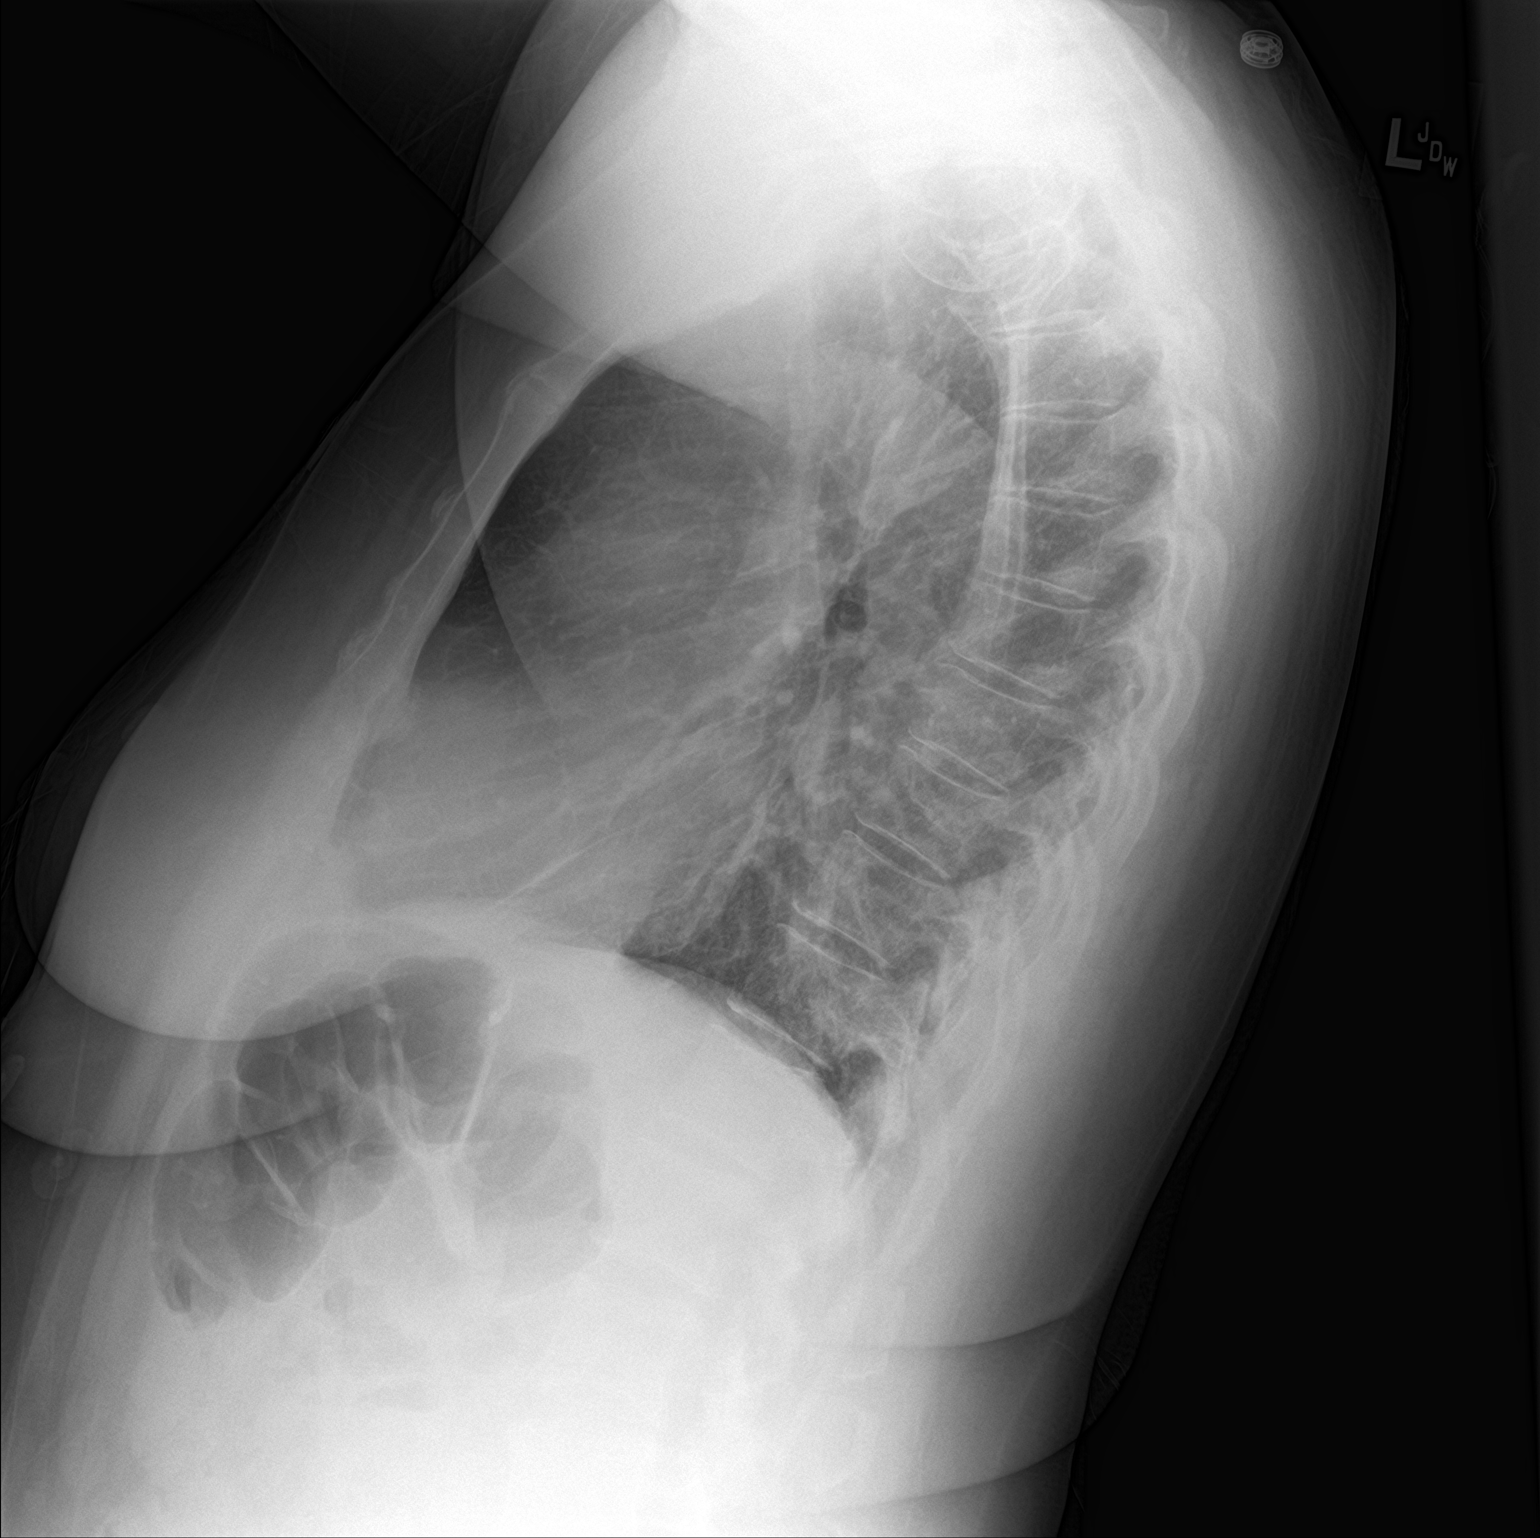

[2 of 2 positions shown; findings below may reference images not displayed]

FINDINGS: Cardiomediastinal silhouette upper limits normal in size. No active
infiltrates or failure. No effusion or pneumothorax. Calcified
tortuous aorta.
IMPRESSION: No active cardiopulmonary disease.

## 2018-04-18 ENCOUNTER — Ambulatory Visit: Payer: BLUE CROSS/BLUE SHIELD | Admitting: Nurse Practitioner

## 2018-04-18 DIAGNOSIS — Z1231 Encounter for screening mammogram for malignant neoplasm of breast: Secondary | ICD-10-CM | POA: Diagnosis not present

## 2018-05-03 ENCOUNTER — Other Ambulatory Visit: Payer: Self-pay | Admitting: Nurse Practitioner

## 2018-07-09 ENCOUNTER — Other Ambulatory Visit: Payer: Self-pay | Admitting: Nurse Practitioner

## 2018-08-15 ENCOUNTER — Ambulatory Visit: Payer: BLUE CROSS/BLUE SHIELD | Admitting: Nurse Practitioner

## 2019-02-16 ENCOUNTER — Encounter: Payer: BLUE CROSS/BLUE SHIELD | Admitting: Nurse Practitioner

## 2019-02-25 ENCOUNTER — Other Ambulatory Visit: Payer: Self-pay | Admitting: Nurse Practitioner

## 2019-03-28 ENCOUNTER — Telehealth: Payer: Self-pay

## 2019-03-28 NOTE — Telephone Encounter (Signed)
We received a refill request for atorvastatin but pt hasnt been here in a while so I called her to schedule an appt. I left her a vm to call the office. YRL,RMA

## 2019-03-30 ENCOUNTER — Other Ambulatory Visit: Payer: Self-pay

## 2019-03-30 ENCOUNTER — Encounter: Payer: Self-pay | Admitting: Internal Medicine

## 2019-03-30 ENCOUNTER — Ambulatory Visit: Payer: BC Managed Care – PPO | Admitting: Internal Medicine

## 2019-03-30 ENCOUNTER — Ambulatory Visit (INDEPENDENT_AMBULATORY_CARE_PROVIDER_SITE_OTHER): Payer: BC Managed Care – PPO | Admitting: Internal Medicine

## 2019-03-30 VITALS — BP 122/70 | HR 67 | Temp 98.2°F | Ht 70.0 in | Wt 244.6 lb

## 2019-03-30 DIAGNOSIS — E669 Obesity, unspecified: Secondary | ICD-10-CM

## 2019-03-30 DIAGNOSIS — E6609 Other obesity due to excess calories: Secondary | ICD-10-CM | POA: Diagnosis not present

## 2019-03-30 DIAGNOSIS — E559 Vitamin D deficiency, unspecified: Secondary | ICD-10-CM

## 2019-03-30 DIAGNOSIS — R0789 Other chest pain: Secondary | ICD-10-CM

## 2019-03-30 DIAGNOSIS — Z1211 Encounter for screening for malignant neoplasm of colon: Secondary | ICD-10-CM

## 2019-03-30 DIAGNOSIS — Z0000 Encounter for general adult medical examination without abnormal findings: Secondary | ICD-10-CM

## 2019-03-30 DIAGNOSIS — R635 Abnormal weight gain: Secondary | ICD-10-CM

## 2019-03-30 DIAGNOSIS — R7303 Prediabetes: Secondary | ICD-10-CM

## 2019-03-30 DIAGNOSIS — Z6835 Body mass index (BMI) 35.0-35.9, adult: Secondary | ICD-10-CM

## 2019-03-30 LAB — POCT URINALYSIS DIPSTICK
Bilirubin, UA: NEGATIVE
Blood, UA: NEGATIVE
Glucose, UA: NEGATIVE
Ketones, UA: NEGATIVE
Leukocytes, UA: NEGATIVE
Nitrite, UA: NEGATIVE
Protein, UA: NEGATIVE
Spec Grav, UA: 1.02 (ref 1.010–1.025)
Urobilinogen, UA: 0.2 E.U./dL
pH, UA: 6.5 (ref 5.0–8.0)

## 2019-03-30 NOTE — Progress Notes (Signed)
Subjective:     Patient ID: Carol Gray , female    DOB: 08-21-1955 , 63 y.o.   MRN: [id number]   Chief Complaint  Patient presents with  . Annual Exam    HPI Pt is here for her yearly physical.  Eye exam is over due, dental check is up to date. Has GYN. Minimal exercising and knows she has been gaining wt since she has been working from home.    Past Medical History:  Diagnosis Date  . Abnormal Pap smear   . BV (bacterial vaginosis)    h/o  . Hypercholesteremia   . PMB (postmenopausal bleeding)      Family History  Problem Relation Age of Onset  . Cancer Mother        uterine  . Hypertension Maternal Uncle   . Diabetes Maternal Uncle   . Diabetes Maternal Aunt   . Cancer Maternal Aunt        ovarian  . Diabetes Maternal Grandmother   . Cancer Father        lung     Current Outpatient Medications:  .  atorvastatin (LIPITOR) 40 MG tablet, TAKE 1 TABLET BY MOUTH EVERY DAY, Disp: 90 tablet, Rfl: 0 .  meclizine (ANTIVERT) 25 MG tablet, Take 25 mg by mouth every 6 (six) hours as needed for dizziness., Disp: , Rfl: 0 .  ondansetron (ZOFRAN ODT) 4 MG disintegrating tablet, Take 1 tablet (4 mg total) by mouth every 8 (eight) hours as needed for nausea or vomiting. (Patient not taking: Reported on 08/24/2016), Disp: 10 tablet, Rfl: 0   No Known Allergies   Review of Systems   L knee contusion 3 weeks ago when she fell on her L knee, but is getting  better, + weight gain, the rest of ROS is neg.  Today's Vitals   03/30/19 0935  BP: 122/70  Pulse: 67  Temp: 98.2 F (36.8 C)  TempSrc: Oral  Weight: 244 lb 9.6 oz (110.9 kg)  Height: 5\' 10"  (1.778 m)   Body mass index is 35.1 kg/m.   Objective:  Physical Exam  BP 122/70   Pulse 67   Temp 98.2 F (36.8 C) (Oral)   Ht 5\' 10"  (1.778 m)   Wt 244 lb 9.6 oz (110.9 kg)   BMI 35.10 kg/m   General Appearance:    Alert, cooperative, no distress, appears stated age  Head:    Normocephalic, without obvious  abnormality, atraumatic  Eyes:    PERRL, conjunctiva/corneas clear, EOM's intact, fundi    benign, both eyes  Ears:    Normal TM's and external ear canals, both ears  Nose:   Nares normal, septum midline, mucosa normal, no drainage    or sinus tenderness  Throat:   Lips, mucosa, and tongue normal; teeth and gums normal  Neck:   Supple, symmetrical, trachea midline, no adenopathy;    thyroid:  no enlargement/tenderness/nodules; no carotid   bruit  Back:     Symmetric, no curvature, ROM normal, no CVA tenderness  Lungs:     Clear to auscultation bilaterally, respirations unlabored  Chest Wall:    No tenderness or deformity   Heart:    Regular rate and rhythm, S1 and S2 normal, no murmur, rub   or gallop  Breast Exam:    No tenderness, masses, or nipple abnormality  Abdomen:     Soft, non-tender, bowel sounds active all four quadrants,    no masses, no organomegaly  Extremities:   Extremities normal, atraumatic, no cyanosis or edema. R knee joint is larger than L, but is not tender. Has a raised slightly tender lump on her L patella with no crepitations. ROM of this knee is normal.   Pulses:   2+ and symmetric all extremities  Skin:   Skin color, texture, turgor normal, no rashes or lesions  Lymph nodes:   Cervical, supraclavicular, and axillary nodes normal  Neurologic:   CNII-XII intact, normal strength, sensation and reflexes    Throughout. Has normal Romberg, tandem gait, finger to nose, tip toe and heel gait.       Assessment And Plan:  1. Encounter for annual physical exam- routine. Fu 1 y - POCT Urinalysis Dipstick (81002) - CMP14 + Anion Gap - CBC no Diff - TSH - T4, Free - T3, free - Lipid Profile  2. Screen colon cancer. - Ambulatory referral to Gastroenterology  3. Obesity (BMI 35.0-39.9 without comorbidity)- chronic and worsening. Encouraged to work on increasing activities, since she feels stressed try complex Vit B, and decrease carb intake, see  instructions.  4. Class 2 obesity due to excess calories with body mass index (BMI) of 35.0 to 35.9 in adult, unspecified whether serious comorbidity present- worsening. Encouraged to work on increasing activities, since she feels stressed try complex Vit B, and decrease carb intake, see instructions.   5. Prediabetes- chronic - Hemoglobin A1c  6. Vitamin D deficiency- chronic - Vitamin D 1,25 Dihydroxy  7. Weight gain- due to being sedentary.  - TSH - T4, Free - T3, free       Laporsha Grealish RODRIGUEZ-SOUTHWORTH, PA-C    THE PATIENT IS ENCOURAGED TO PRACTICE SOCIAL DISTANCING DUE TO THE COVID-19 PANDEMIC.

## 2019-03-30 NOTE — Patient Instructions (Signed)
Take a complex B vitamins to help with stress. Eat berries instead of bread, or potatoes and cut your carbs to 1/4 cup per serving for lunch and dinner. Snack on 12 nuts if you get hungry.    Preventive Care 33-61 Years Old, Female Preventive care refers to visits with your health care provider and lifestyle choices that can promote health and wellness. This includes:  A yearly physical exam. This may also be called an annual well check.  Regular dental visits and eye exams.  Immunizations.  Screening for certain conditions.  Healthy lifestyle choices, such as eating a healthy diet, getting regular exercise, not using drugs or products that contain nicotine and tobacco, and limiting alcohol use. What can I expect for my preventive care visit? Physical exam Your health care provider will check your:  Height and weight. This may be used to calculate body mass index (BMI), which tells if you are at a healthy weight.  Heart rate and blood pressure.  Skin for abnormal spots. Counseling Your health care provider may ask you questions about your:  Alcohol, tobacco, and drug use.  Emotional well-being.  Home and relationship well-being.  Sexual activity.  Eating habits.  Work and work Statistician.  Method of birth control.  Menstrual cycle.  Pregnancy history. What immunizations do I need?  Influenza (flu) vaccine  This is recommended every year. Tetanus, diphtheria, and pertussis (Tdap) vaccine  You may need a Td booster every 10 years. Varicella (chickenpox) vaccine  You may need this if you have not been vaccinated. Zoster (shingles) vaccine  You may need this after age 5. Measles, mumps, and rubella (MMR) vaccine  You may need at least one dose of MMR if you were born in 1957 or later. You may also need a second dose. Pneumococcal conjugate (PCV13) vaccine  You may need this if you have certain conditions and were not previously vaccinated. Pneumococcal  polysaccharide (PPSV23) vaccine  You may need one or two doses if you smoke cigarettes or if you have certain conditions. Meningococcal conjugate (MenACWY) vaccine  You may need this if you have certain conditions. Hepatitis A vaccine  You may need this if you have certain conditions or if you travel or work in places where you may be exposed to hepatitis A. Hepatitis B vaccine  You may need this if you have certain conditions or if you travel or work in places where you may be exposed to hepatitis B. Haemophilus influenzae type b (Hib) vaccine  You may need this if you have certain conditions. Human papillomavirus (HPV) vaccine  If recommended by your health care provider, you may need three doses over 6 months. You may receive vaccines as individual doses or as more than one vaccine together in one shot (combination vaccines). Talk with your health care provider about the risks and benefits of combination vaccines. What tests do I need? Blood tests  Lipid and cholesterol levels. These may be checked every 5 years, or more frequently if you are over 85 years old.  Hepatitis C test.  Hepatitis B test. Screening  Lung cancer screening. You may have this screening every year starting at age 69 if you have a 30-pack-year history of smoking and currently smoke or have quit within the past 15 years.  Colorectal cancer screening. All adults should have this screening starting at age 36 and continuing until age 26. Your health care provider may recommend screening at age 71 if you are at increased risk. You will  have tests every 1-10 years, depending on your results and the type of screening test.  Diabetes screening. This is done by checking your blood sugar (glucose) after you have not eaten for a while (fasting). You may have this done every 1-3 years.  Mammogram. This may be done every 1-2 years. Talk with your health care provider about when you should start having regular  mammograms. This may depend on whether you have a family history of breast cancer.  BRCA-related cancer screening. This may be done if you have a family history of breast, ovarian, tubal, or peritoneal cancers.  Pelvic exam and Pap test. This may be done every 3 years starting at age 24. Starting at age 59, this may be done every 5 years if you have a Pap test in combination with an HPV test. Other tests  Sexually transmitted disease (STD) testing.  Bone density scan. This is done to screen for osteoporosis. You may have this scan if you are at high risk for osteoporosis. Follow these instructions at home: Eating and drinking  Eat a diet that includes fresh fruits and vegetables, whole grains, lean protein, and low-fat dairy.  Take vitamin and mineral supplements as recommended by your health care provider.  Do not drink alcohol if: ? Your health care provider tells you not to drink. ? You are pregnant, may be pregnant, or are planning to become pregnant.  If you drink alcohol: ? Limit how much you have to 0-1 drink a day. ? Be aware of how much alcohol is in your drink. In the [place] one drink equals one 12 oz bottle of beer (355 mL), one 5 oz glass of wine (148 mL), or one 1 oz glass of hard liquor (44 mL). Lifestyle  Take daily care of your teeth and gums.  Stay active. Exercise for at least 30 minutes on 5 or more days each week.  Do not use any products that contain nicotine or tobacco, such as cigarettes, e-cigarettes, and chewing tobacco. If you need help quitting, ask your health care provider.  If you are sexually active, practice safe sex. Use a condom or other form of birth control (contraception) in order to prevent pregnancy and STIs (sexually transmitted infections).  If told by your health care provider, take low-dose aspirin daily starting at age 52. What's next?  Visit your health care provider once a year for a well check visit.  Ask your health care provider  how often you should have your eyes and teeth checked.  Stay up to date on all vaccines. This information is not intended to replace advice given to you by your health care provider. Make sure you discuss any questions you have with your health care provider. Document Released: 05/31/2015 Document Revised: 01/13/2018 Document Reviewed: 01/13/2018 Elsevier Patient Education  2020 [employer].

## 2019-04-03 ENCOUNTER — Other Ambulatory Visit: Payer: Self-pay

## 2019-04-03 MED ORDER — ATORVASTATIN CALCIUM 40 MG PO TABS: 40.0000 mg | ORAL_TABLET | Freq: Every day | ORAL | 1 refills | Status: DC

## 2019-04-05 DIAGNOSIS — Z124 Encounter for screening for malignant neoplasm of cervix: Secondary | ICD-10-CM | POA: Diagnosis not present

## 2019-04-05 DIAGNOSIS — Z01419 Encounter for gynecological examination (general) (routine) without abnormal findings: Secondary | ICD-10-CM | POA: Diagnosis not present

## 2019-04-05 DIAGNOSIS — Z1231 Encounter for screening mammogram for malignant neoplasm of breast: Secondary | ICD-10-CM | POA: Diagnosis not present

## 2019-04-08 LAB — LIPID PANEL
Chol/HDL Ratio: 3 ratio (ref 0.0–4.4)
Cholesterol, Total: 148 mg/dL (ref 100–199)
HDL: 49 mg/dL (ref >39)
LDL Chol Calc (NIH): 84 mg/dL (ref 0–99)
Triglycerides: 76 mg/dL (ref 0–149)
VLDL Cholesterol Cal: 15 mg/dL (ref 5–40)

## 2019-04-08 LAB — CBC
Hematocrit: 40.7 % (ref 34.0–46.6)
Hemoglobin: 13.1 g/dL (ref 11.1–15.9)
MCH: 28.6 pg (ref 26.6–33.0)
MCHC: 32.2 g/dL (ref 31.5–35.7)
MCV: 89 fL (ref 79–97)
Platelets: 216 10*3/uL (ref 150–450)
RBC: 4.58 x10E6/uL (ref 3.77–5.28)
RDW: 13.2 % (ref 11.7–15.4)
WBC: 4.3 10*3/uL (ref 3.4–10.8)

## 2019-04-08 LAB — TSH: TSH: 2.05 u[IU]/mL (ref 0.450–4.500)

## 2019-04-08 LAB — CMP14 + ANION GAP
ALT: 20 IU/L (ref 0–32)
AST: 20 IU/L (ref 0–40)
Albumin/Globulin Ratio: 1.7 (ref 1.2–2.2)
Albumin: 4.3 g/dL (ref 3.8–4.9)
Alkaline Phosphatase: 97 IU/L (ref 39–117)
Anion Gap: 14 mmol/L (ref 10.0–18.0)
BUN/Creatinine Ratio: 13 (ref 12–28)
BUN: 12 mg/dL (ref 8–27)
Bilirubin Total: 1.1 mg/dL (ref 0.0–1.2)
CO2: 24 mmol/L (ref 20–29)
Calcium: 9.5 mg/dL (ref 8.7–10.3)
Chloride: 104 mmol/L (ref 96–106)
Creatinine, Ser: 0.95 mg/dL (ref 0.57–1.00)
GFR calc Af Amer: 75 mL/min/{1.73_m2} (ref >59)
GFR calc non Af Amer: 65 mL/min/{1.73_m2} (ref >59)
Globulin, Total: 2.6 g/dL (ref 1.5–4.5)
Glucose: 99 mg/dL (ref 65–99)
Potassium: 3.8 mmol/L (ref 3.5–5.2)
Sodium: 142 mmol/L (ref 134–144)
Total Protein: 6.9 g/dL (ref 6.0–8.5)

## 2019-04-08 LAB — T4, FREE: Free T4: 1.11 ng/dL (ref 0.82–1.77)

## 2019-04-08 LAB — HEMOGLOBIN A1C
Est. average glucose Bld gHb Est-mCnc: 128 mg/dL
Hgb A1c MFr Bld: 6.1 % — ABNORMAL HIGH (ref 4.8–5.6)

## 2019-04-08 LAB — T3, FREE: T3, Free: 3.6 pg/mL (ref 2.0–4.4)

## 2019-04-08 LAB — VITAMIN D 1,25 DIHYDROXY
Vitamin D 1, 25 (OH)2 Total: 85 pg/mL — ABNORMAL HIGH
Vitamin D2 1, 25 (OH)2: <10 pg/mL
Vitamin D3 1, 25 (OH)2: 79 pg/mL

## 2019-04-24 DIAGNOSIS — Z1231 Encounter for screening mammogram for malignant neoplasm of breast: Secondary | ICD-10-CM | POA: Diagnosis not present

## 2019-04-24 LAB — HM MAMMOGRAPHY

## 2019-04-24 LAB — HM DEXA SCAN: HM Dexa Scan: NORMAL

## 2019-04-25 ENCOUNTER — Telehealth: Payer: Self-pay

## 2019-04-25 NOTE — Telephone Encounter (Signed)
Pt called Burning and cramping in lower abdomen, constipation hadn't taken a laxative in over 2wks. Stopped thinking maybe that was causing the problem. Cramping 1wk burning couple wks. Eat a lot of spicy foods as well. She has an appt Thursday to set up colonoscopy do you recommend her having an appt here or just wait to see what the other office advices? [phone]     Per provider  Minette Brine, FNP  Candiss Norse T, CMA  Caller: Unspecified (Today, 3:21 PM)        I recommend she wait until her appt with GI on Thursday.

## 2019-04-25 NOTE — Telephone Encounter (Signed)
I recommend she wait until her appt with GI on Thursday.

## 2019-04-25 NOTE — Telephone Encounter (Signed)
Burning and cramping in lower abdomen, constipation hadn't taken a laxative in over 2wks. Stopped thinking maybe that was causing the problem. Cramping 1wk burning couple wks. Eat a lot of spicy foods as well. She has an appt Thursday to set up colonoscopy do you recommend her having an appt here or just wait to see what the other office advices? [phone]

## 2019-04-27 DIAGNOSIS — R140 Abdominal distension (gaseous): Secondary | ICD-10-CM | POA: Diagnosis not present

## 2019-04-27 DIAGNOSIS — K219 Gastro-esophageal reflux disease without esophagitis: Secondary | ICD-10-CM | POA: Diagnosis not present

## 2019-04-27 DIAGNOSIS — K5904 Chronic idiopathic constipation: Secondary | ICD-10-CM | POA: Diagnosis not present

## 2019-04-27 DIAGNOSIS — Z1211 Encounter for screening for malignant neoplasm of colon: Secondary | ICD-10-CM | POA: Diagnosis not present

## 2019-05-15 DIAGNOSIS — Z1211 Encounter for screening for malignant neoplasm of colon: Secondary | ICD-10-CM | POA: Diagnosis not present

## 2019-05-15 LAB — HM COLONOSCOPY

## 2019-05-16 ENCOUNTER — Encounter: Payer: Self-pay | Admitting: Nurse Practitioner

## 2019-08-21 ENCOUNTER — Other Ambulatory Visit: Payer: Self-pay

## 2019-08-21 ENCOUNTER — Telehealth: Payer: Self-pay

## 2019-08-21 MED ORDER — ATORVASTATIN CALCIUM 40 MG PO TABS: 40.0000 mg | ORAL_TABLET | Freq: Every day | ORAL | 0 refills | Status: DC

## 2019-08-21 NOTE — Telephone Encounter (Signed)
The pt called and said that [facility] said that she needed to contact the office about her Atorvastatin refill.  The pt was told that the refill may have been denied because the pt doesn't have a f/u scheduled.  The pt scheduled her f/u appt and was told to make sure she comes for a f/u before her refill runs out.

## 2019-09-05 ENCOUNTER — Ambulatory Visit: Payer: BC Managed Care – PPO | Admitting: Nurse Practitioner

## 2019-09-05 ENCOUNTER — Other Ambulatory Visit: Payer: Self-pay

## 2019-09-05 ENCOUNTER — Encounter: Payer: Self-pay | Admitting: Nurse Practitioner

## 2019-09-05 VITALS — BP 132/80 | HR 75 | Temp 98.2°F | Wt 238.8 lb

## 2019-09-05 DIAGNOSIS — Z1159 Encounter for screening for other viral diseases: Secondary | ICD-10-CM

## 2019-09-05 DIAGNOSIS — R7303 Prediabetes: Secondary | ICD-10-CM

## 2019-09-05 DIAGNOSIS — F419 Anxiety disorder, unspecified: Secondary | ICD-10-CM

## 2019-09-05 DIAGNOSIS — E782 Mixed hyperlipidemia: Secondary | ICD-10-CM | POA: Diagnosis not present

## 2019-09-05 DIAGNOSIS — E559 Vitamin D deficiency, unspecified: Secondary | ICD-10-CM | POA: Diagnosis not present

## 2019-09-05 DIAGNOSIS — R5383 Other fatigue: Secondary | ICD-10-CM

## 2019-09-05 DIAGNOSIS — R079 Chest pain, unspecified: Secondary | ICD-10-CM | POA: Diagnosis not present

## 2019-09-05 MED ORDER — MAGNESIUM 200 MG PO TABS: ORAL_TABLET | ORAL | 2 refills | Status: DC

## 2019-09-05 NOTE — Progress Notes (Signed)
This visit occurred during the SARS-CoV-2 public health emergency.  Safety protocols were in place, including screening questions prior to the visit, additional usage of staff PPE, and extensive cleaning of exam room while observing appropriate contact time as indicated for disinfecting solutions.  Subjective:     Patient ID: Carol Gray , female    DOB: Oct 15, 1955 , 64 y.o.   MRN: [id number]   Chief Complaint  Patient presents with  . Hyperlipidemia  . Chest Pain    patient stated she feels like she has been light headed, numbness in her lips, tingling in her legs    HPI  She is feeling drained, fatigue and tired. She had her second vaccine 1.5 weeks ago.  The lip tingling has been present for couple of years. Also having lower extremity tingling, took a lot of effort to move on Sunday.  She has had a full workup about 3 years ago, also had fatigue as well. She does a little bit of exercise but not at the gym.  She is working from home, works as a Arts development officer day.  She does admit to being a Patent attorney". She has taken Zoloft in the past.   Hyperlipidemia This is a chronic problem. The current episode started more than 1 year ago. Associated symptoms include chest pain.  Chest Pain  This is a recurrent problem. The current episode started in the past 7 days (in the last couple of days). The onset quality is gradual. The quality of the pain is described as pressure. The pain does not radiate. Pertinent negatives include no abdominal pain, cough, dizziness, headaches or numbness. The pain is aggravated by nothing. Risk factors include obesity.  Her past medical history is significant for hyperlipidemia.  Knee Pain  The incident occurred more than 1 week ago. There was no injury mechanism. The pain is present in the right knee. The quality of the pain is described as aching. The pain is at a severity of 1/10. The pain is mild. The pain has been intermittent since onset. Pertinent negatives  include no inability to bear weight or numbness.     Past Medical History:  Diagnosis Date  . Abnormal Pap smear   . BV (bacterial vaginosis)    h/o  . Hypercholesteremia   . PMB (postmenopausal bleeding)      Family History  Problem Relation Age of Onset  . Cancer Mother        uterine  . Hypertension Maternal Uncle   . Diabetes Maternal Uncle   . Diabetes Maternal Aunt   . Cancer Maternal Aunt        ovarian  . Diabetes Maternal Grandmother   . Cancer Father        lung     Current Outpatient Medications:  .  atorvastatin (LIPITOR) 40 MG tablet, Take 1 tablet (40 mg total) by mouth daily., Disp: 90 tablet, Rfl: 0   No Known Allergies   Review of Systems  Constitutional: Positive for fatigue. Negative for chills.  Respiratory: Negative.  Negative for cough.   Cardiovascular: Positive for chest pain.  Gastrointestinal: Negative for abdominal pain.  Neurological: Negative for dizziness, numbness and headaches.  Psychiatric/Behavioral: Negative.      Today's Vitals   09/05/19 0952  BP: 132/80  Pulse: 75  Temp: 98.2 F (36.8 C)  TempSrc: Oral  Weight: 238 lb 12.8 oz (108.3 kg)  PainSc: 0-No pain   Body mass index is 34.26 kg/m.   Objective:  Physical  Exam Constitutional:      Appearance: She is well-developed.  Pulmonary:     Effort: Pulmonary effort is normal.  Skin:    Capillary Refill: Capillary refill takes less than 2 seconds.  Neurological:     General: No focal deficit present.     Mental Status: She is alert.  Psychiatric:        Mood and Affect: Mood is anxious.        Behavior: Behavior normal.         Assessment And Plan:    1. Chest pain, unspecified type  EKG done with NSR HR 60  I do not feel her symptoms are cardiac related  She does report a history of feeling stressed and worried often - EKG 12-Lead  2. Prediabetes  Chronic, will check HgbA1c  - CMP14+EGFR - Hemoglobin A1c  3. Vitamin D deficiency  Will check  vitamin D level and supplement as needed.     Also encouraged to spend 15 minutes in the sun daily.   4. Mixed hyperlipidemia  Chronic, stable  Tolerating medications well  Will check lipid panel - Lipid panel  5. Fatigue, unspecified type  Will check for metabolic causes - TSH - Vitamin B12 - CBC  6. Anxiety  Will try magnesium in the evening  Also encouraged to take deep breaths and do activities related to self care  Offered to refer to counseling declines at this time  She has been treated at the ER for similar symptoms and a full cardiac work up - Magnesium 200 MG TABS; Take 1 tablet by mouth daily with evening meal  Dispense: 30 tablet; Refill: 2  7. Encounter for hepatitis C screening test for low risk patient  Will check for Hepatitis C screening due to being born between the years 1945-1965 - Hepatitis C antibody   Minette Brine, FNP    THE PATIENT IS ENCOURAGED TO PRACTICE SOCIAL DISTANCING DUE TO THE COVID-19 PANDEMIC.

## 2019-09-06 LAB — CMP14+EGFR
ALT: 21 IU/L (ref 0–32)
AST: 24 IU/L (ref 0–40)
Albumin/Globulin Ratio: 1.6 (ref 1.2–2.2)
Albumin: 4.5 g/dL (ref 3.8–4.8)
Alkaline Phosphatase: 97 IU/L (ref 39–117)
BUN/Creatinine Ratio: 13 (ref 12–28)
BUN: 12 mg/dL (ref 8–27)
Bilirubin Total: 1 mg/dL (ref 0.0–1.2)
CO2: 21 mmol/L (ref 20–29)
Calcium: 9.8 mg/dL (ref 8.7–10.3)
Chloride: 106 mmol/L (ref 96–106)
Creatinine, Ser: 0.92 mg/dL (ref 0.57–1.00)
GFR calc Af Amer: 78 mL/min/{1.73_m2} (ref >59)
GFR calc non Af Amer: 67 mL/min/{1.73_m2} (ref >59)
Globulin, Total: 2.8 g/dL (ref 1.5–4.5)
Glucose: 97 mg/dL (ref 65–99)
Potassium: 4.1 mmol/L (ref 3.5–5.2)
Sodium: 144 mmol/L (ref 134–144)
Total Protein: 7.3 g/dL (ref 6.0–8.5)

## 2019-09-06 LAB — CBC
Hematocrit: 41.1 % (ref 34.0–46.6)
Hemoglobin: 13.5 g/dL (ref 11.1–15.9)
MCH: 29 pg (ref 26.6–33.0)
MCHC: 32.8 g/dL (ref 31.5–35.7)
MCV: 88 fL (ref 79–97)
Platelets: 207 10*3/uL (ref 150–450)
RBC: 4.65 x10E6/uL (ref 3.77–5.28)
RDW: 13.1 % (ref 11.7–15.4)
WBC: 4.1 10*3/uL (ref 3.4–10.8)

## 2019-09-06 LAB — LIPID PANEL
Chol/HDL Ratio: 2.9 ratio (ref 0.0–4.4)
Cholesterol, Total: 153 mg/dL (ref 100–199)
HDL: 52 mg/dL (ref >39)
LDL Chol Calc (NIH): 86 mg/dL (ref 0–99)
Triglycerides: 76 mg/dL (ref 0–149)
VLDL Cholesterol Cal: 15 mg/dL (ref 5–40)

## 2019-09-06 LAB — HEMOGLOBIN A1C
Est. average glucose Bld gHb Est-mCnc: 123 mg/dL
Hgb A1c MFr Bld: 5.9 % — ABNORMAL HIGH (ref 4.8–5.6)

## 2019-09-06 LAB — TSH: TSH: 1.95 u[IU]/mL (ref 0.450–4.500)

## 2019-09-06 LAB — HEPATITIS C ANTIBODY: Hep C Virus Ab: <0.1 s/co ratio (ref 0.0–0.9)

## 2019-09-06 LAB — VITAMIN B12: Vitamin B-12: 324 pg/mL (ref 232–1245)

## 2019-10-10 ENCOUNTER — Ambulatory Visit: Payer: Self-pay | Admitting: Nurse Practitioner

## 2019-10-24 ENCOUNTER — Ambulatory Visit: Payer: BC Managed Care – PPO | Admitting: Nurse Practitioner

## 2019-11-22 ENCOUNTER — Other Ambulatory Visit: Payer: Self-pay

## 2019-11-22 MED ORDER — ATORVASTATIN CALCIUM 40 MG PO TABS: 40.0000 mg | ORAL_TABLET | Freq: Every day | ORAL | 0 refills | Status: DC

## 2020-02-29 ENCOUNTER — Other Ambulatory Visit: Payer: Self-pay | Admitting: Nurse Practitioner

## 2020-03-06 ENCOUNTER — Other Ambulatory Visit: Payer: Self-pay

## 2020-03-06 MED ORDER — ATORVASTATIN CALCIUM 40 MG PO TABS: ORAL_TABLET | ORAL | 0 refills | Status: DC

## 2020-04-04 ENCOUNTER — Ambulatory Visit (INDEPENDENT_AMBULATORY_CARE_PROVIDER_SITE_OTHER): Payer: BC Managed Care – PPO | Admitting: Nurse Practitioner

## 2020-04-04 ENCOUNTER — Encounter: Payer: Self-pay | Admitting: Nurse Practitioner

## 2020-04-04 ENCOUNTER — Other Ambulatory Visit: Payer: Self-pay

## 2020-04-04 VITALS — BP 128/76 | HR 60 | Temp 98.1°F | Ht 70.0 in | Wt 245.0 lb

## 2020-04-04 DIAGNOSIS — H6121 Impacted cerumen, right ear: Secondary | ICD-10-CM

## 2020-04-04 DIAGNOSIS — E559 Vitamin D deficiency, unspecified: Secondary | ICD-10-CM | POA: Diagnosis not present

## 2020-04-04 DIAGNOSIS — Z114 Encounter for screening for human immunodeficiency virus [HIV]: Secondary | ICD-10-CM | POA: Diagnosis not present

## 2020-04-04 DIAGNOSIS — E782 Mixed hyperlipidemia: Secondary | ICD-10-CM

## 2020-04-04 DIAGNOSIS — Z6835 Body mass index (BMI) 35.0-35.9, adult: Secondary | ICD-10-CM

## 2020-04-04 DIAGNOSIS — R7303 Prediabetes: Secondary | ICD-10-CM | POA: Diagnosis not present

## 2020-04-04 DIAGNOSIS — E6609 Other obesity due to excess calories: Secondary | ICD-10-CM

## 2020-04-04 DIAGNOSIS — Z0000 Encounter for general adult medical examination without abnormal findings: Secondary | ICD-10-CM | POA: Diagnosis not present

## 2020-04-04 LAB — POCT URINALYSIS DIPSTICK
Bilirubin, UA: NEGATIVE
Blood, UA: NEGATIVE
Glucose, UA: NEGATIVE
Ketones, UA: NEGATIVE
Leukocytes, UA: NEGATIVE
Nitrite, UA: NEGATIVE
Protein, UA: NEGATIVE
Spec Grav, UA: 1.02 (ref 1.010–1.025)
Urobilinogen, UA: 0.2 E.U./dL
pH, UA: 6.5 (ref 5.0–8.0)

## 2020-04-04 LAB — POCT UA - MICROALBUMIN
Albumin/Creatinine Ratio, Urine, POC: 30
Creatinine, POC: 50 mg/dL
Microalbumin Ur, POC: 10 mg/L

## 2020-04-04 LAB — LIPID PANEL
Chol/HDL Ratio: 3 ratio (ref 0.0–4.4)
Cholesterol, Total: 132 mg/dL (ref 100–199)
HDL: 44 mg/dL (ref >39)
LDL Chol Calc (NIH): 74 mg/dL (ref 0–99)
Triglycerides: 65 mg/dL (ref 0–149)
VLDL Cholesterol Cal: 14 mg/dL (ref 5–40)

## 2020-04-04 MED ORDER — ATORVASTATIN CALCIUM 40 MG PO TABS: ORAL_TABLET | ORAL | 0 refills | Status: DC

## 2020-04-04 NOTE — Patient Instructions (Signed)
Health Maintenance, Female Adopting a healthy lifestyle and getting preventive care are important in promoting health and wellness. Ask your health care provider about:  The right schedule for you to have regular tests and exams.  Things you can do on your own to prevent diseases and keep yourself healthy. What should I know about diet, weight, and exercise? Eat a healthy diet   Eat a diet that includes plenty of vegetables, fruits, low-fat dairy products, and lean protein.  Do not eat a lot of foods that are high in solid fats, added sugars, or sodium. Maintain a healthy weight Body mass index (BMI) is used to identify weight problems. It estimates body fat based on height and weight. Your health care provider can help determine your BMI and help you achieve or maintain a healthy weight. Get regular exercise Get regular exercise. This is one of the most important things you can do for your health. Most adults should:  Exercise for at least 150 minutes each week. The exercise should increase your heart rate and make you sweat (moderate-intensity exercise).  Do strengthening exercises at least twice a week. This is in addition to the moderate-intensity exercise.  Spend less time sitting. Even light physical activity can be beneficial. Watch cholesterol and blood lipids Have your blood tested for lipids and cholesterol at 62 years of age, then have this test every 5 years. Have your cholesterol levels checked more often if:  Your lipid or cholesterol levels are high.  You are older than 62 years of age.  You are at high risk for heart disease. What should I know about cancer screening? Depending on your health history and family history, you may need to have cancer screening at various ages. This may include screening for:  Breast cancer.  Cervical cancer.  Colorectal cancer.  Skin cancer.  Lung cancer. What should I know about heart disease, diabetes, and high blood  pressure? Blood pressure and heart disease  High blood pressure causes heart disease and increases the risk of stroke. This is more likely to develop in people who have high blood pressure readings, are of African descent, or are overweight.  Have your blood pressure checked: ? Every 3-5 years if you are 18-39 years of age. ? Every year if you are 40 years old or older. Diabetes Have regular diabetes screenings. This checks your fasting blood sugar level. Have the screening done:  Once every three years after age 40 if you are at a normal weight and have a low risk for diabetes.  More often and at a younger age if you are overweight or have a high risk for diabetes. What should I know about preventing infection? Hepatitis B If you have a higher risk for hepatitis B, you should be screened for this virus. Talk with your health care provider to find out if you are at risk for hepatitis B infection. Hepatitis C Testing is recommended for:  Everyone born from 1945 through 1965.  Anyone with known risk factors for hepatitis C. Sexually transmitted infections (STIs)  Get screened for STIs, including gonorrhea and chlamydia, if: ? You are sexually active and are younger than 62 years of age. ? You are older than 62 years of age and your health care provider tells you that you are at risk for this type of infection. ? Your sexual activity has changed since you were last screened, and you are at increased risk for chlamydia or gonorrhea. Ask your health care provider if   you are at risk.  Ask your health care provider about whether you are at high risk for HIV. Your health care provider may recommend a prescription medicine to help prevent HIV infection. If you choose to take medicine to prevent HIV, you should first get tested for HIV. You should then be tested every 3 months for as long as you are taking the medicine. Pregnancy  If you are about to stop having your period (premenopausal) and  you may become pregnant, seek counseling before you get pregnant.  Take 400 to 800 micrograms (mcg) of folic acid every day if you become pregnant.  Ask for birth control (contraception) if you want to prevent pregnancy. Osteoporosis and menopause Osteoporosis is a disease in which the bones lose minerals and strength with aging. This can result in bone fractures. If you are 65 years old or older, or if you are at risk for osteoporosis and fractures, ask your health care provider if you should:  Be screened for bone loss.  Take a calcium or vitamin D supplement to lower your risk of fractures.  Be given hormone replacement therapy (HRT) to treat symptoms of menopause. Follow these instructions at home: Lifestyle  Do not use any products that contain nicotine or tobacco, such as cigarettes, e-cigarettes, and chewing tobacco. If you need help quitting, ask your health care provider.  Do not use street drugs.  Do not share needles.  Ask your health care provider for help if you need support or information about quitting drugs. Alcohol use  Do not drink alcohol if: ? Your health care provider tells you not to drink. ? You are pregnant, may be pregnant, or are planning to become pregnant.  If you drink alcohol: ? Limit how much you use to 0-1 drink a day. ? Limit intake if you are breastfeeding.  Be aware of how much alcohol is in your drink. In the [place] one drink equals one 12 oz bottle of beer (355 mL), one 5 oz glass of wine (148 mL), or one 1 oz glass of hard liquor (44 mL). General instructions  Schedule regular health, dental, and eye exams.  Stay current with your vaccines.  Tell your health care provider if: ? You often feel depressed. ? You have ever been abused or do not feel safe at home. Summary  Adopting a healthy lifestyle and getting preventive care are important in promoting health and wellness.  Follow your health care provider's instructions about healthy  diet, exercising, and getting tested or screened for diseases.  Follow your health care provider's instructions on monitoring your cholesterol and blood pressure. This information is not intended to replace advice given to you by your health care provider. Make sure you discuss any questions you have with your health care provider. Document Revised: 04/27/2018 Document Reviewed: 04/27/2018 Elsevier Patient Education  2020 [employer]  

## 2020-04-04 NOTE — Progress Notes (Signed)
YL,RMA I,Yamilka Roman Lebron,acting as a Education administrator for [employer], FNP.,have documented all relevant documentation on the behalf of Minette Brine, FNP,as directed by  Minette Brine, FNP while in the presence of Minette Brine, [place]. This visit occurred during the SARS-CoV-2 public health emergency.  Safety protocols were in place, including screening questions prior to the visit, additional usage of staff PPE, and extensive cleaning of exam room while observing appropriate contact time as indicated for disinfecting solutions.  Subjective:     Patient ID: Carol Gray , female    DOB: 02-19-1956 , 64 y.o.   MRN: [id number]   Chief Complaint  Patient presents with  . Annual Exam    HPI  Patient here for HM.  She sees Dr. Charlesetta Garibaldi for GYN.  She has been having her PAPs done yearly.  She has her mammograms done at [facility] She is seeing GI for her constipation and is being treated with Linzess which she feels is effective.   Wt Readings from Last 3 Encounters: 04/04/20 : 245 lb (111.1 kg) 09/05/19 : 238 lb 12.8 oz (108.3 kg) 03/30/19 : 244 lb 9.6 oz (110.9 kg)    Past Medical History:  Diagnosis Date  . Abnormal Pap smear   . BV (bacterial vaginosis)    h/o  . Hypercholesteremia   . PMB (postmenopausal bleeding)      Family History  Problem Relation Age of Onset  . Cancer Mother        uterine  . Hypertension Maternal Uncle   . Diabetes Maternal Uncle   . Diabetes Maternal Aunt   . Cancer Maternal Aunt        ovarian  . Diabetes Maternal Grandmother   . Cancer Father        lung     Current Outpatient Medications:  .  atorvastatin (LIPITOR) 40 MG tablet, TAKE 1 TABLET(40 MG) BY MOUTH DAILY, Disp: 90 tablet, Rfl: 0 .  linaCLOtide (LINZESS PO), Take 1 capsule by mouth as needed., Disp: , Rfl:    No Known Allergies    The patient states she is post menopausal status  . Negative for Dysmenorrhea and Negative for Menorrhagia. She has her mammogram scheduled for December 9th.   Negative for: breast discharge, breast lump(s), breast pain and breast self exam. Associated symptoms include abnormal vaginal bleeding. Pertinent negatives include abnormal bleeding (hematology), anxiety, decreased libido, depression, difficulty falling sleep, dyspareunia, history of infertility, nocturia, sexual dysfunction, sleep disturbances, urinary incontinence, urinary urgency, vaginal discharge and vaginal itching. Diet regular.The patient states her exercise level is minimal - she has just went back into the office and was sitting at home. She is doing 2-3 days a week 45 minutes to 1 hour.     The patient's tobacco use is:  Social History   Tobacco Use  Smoking Status Never Smoker  Smokeless Tobacco Never Used   She has been exposed to passive smoke. The patient's alcohol use is:  Social History   Substance and Sexual Activity  Alcohol Use No   Additional information: Last pap 04/2019, next one scheduled for 04/2020 with Dr. Charlesetta Garibaldi  Review of Systems  Constitutional: Negative.   HENT: Negative.   Eyes: Negative.   Respiratory: Negative.   Cardiovascular: Negative.   Gastrointestinal: Negative.   Endocrine: Negative.   Genitourinary: Negative.   Musculoskeletal: Negative.   Skin: Negative.   Allergic/Immunologic: Negative.   Neurological: Negative.   Hematological: Negative.   Psychiatric/Behavioral: Negative.      Today's Vitals  04/04/20 0908  BP: 128/76  Pulse: 60  Temp: 98.1 F (36.7 C)  TempSrc: Oral  Weight: 245 lb (111.1 kg)  Height: 5' 10"  (1.778 m)  PainSc: 0-No pain   Body mass index is 35.15 kg/m.   Objective:  Physical Exam Vitals reviewed.  Constitutional:      General: She is not in acute distress.    Appearance: Normal appearance. She is well-developed. She is obese.  HENT:     Head: Normocephalic and atraumatic.     Right Ear: Hearing and external ear normal. There is impacted cerumen (firm cerumen present).     Left Ear: Hearing and  external ear normal. There is impacted cerumen (firm cerumen present).     Nose:     Comments: Deferred - masked    Mouth/Throat:     Comments: Deferred - masked Eyes:     General: Lids are normal.     Extraocular Movements: Extraocular movements intact.     Conjunctiva/sclera: Conjunctivae normal.     Pupils: Pupils are equal, round, and reactive to light.     Funduscopic exam:    Right eye: No papilledema.        Left eye: No papilledema.  Neck:     Thyroid: No thyroid mass.     Vascular: No carotid bruit.  Cardiovascular:     Rate and Rhythm: Normal rate and regular rhythm.     Pulses: Normal pulses.     Heart sounds: Normal heart sounds. No murmur heard.   Pulmonary:     Effort: Pulmonary effort is normal.     Breath sounds: Normal breath sounds.  Chest:     Chest wall: No mass.     Breasts: Tanner Score is 5.        Right: Normal. No mass or tenderness.        Left: Normal. No mass or tenderness.  Abdominal:     General: Abdomen is flat. Bowel sounds are normal. There is no distension.     Palpations: Abdomen is soft.     Tenderness: There is no abdominal tenderness.  Genitourinary:    Rectum: Guaiac result negative.  Musculoskeletal:        General: No swelling. Normal range of motion.     Cervical back: Full passive range of motion without pain, normal range of motion and neck supple.     Right lower leg: No edema.     Left lower leg: No edema.  Lymphadenopathy:     Upper Body:     Right upper body: No supraclavicular, axillary or pectoral adenopathy.     Left upper body: No supraclavicular, axillary or pectoral adenopathy.  Skin:    General: Skin is warm and dry.     Capillary Refill: Capillary refill takes less than 2 seconds.  Neurological:     General: No focal deficit present.     Mental Status: She is alert and oriented to person, place, and time.     Cranial Nerves: No cranial nerve deficit.     Sensory: No sensory deficit.  Psychiatric:        Mood  and Affect: Mood normal.        Behavior: Behavior normal.        Thought Content: Thought content normal.        Judgment: Judgment normal.         Assessment And Plan:     1. Encounter for general adult medical examination w/o abnormal findings .  Behavior modifications discussed and diet history reviewed.   . Pt will continue to exercise regularly and modify diet with low GI, plant based foods and decrease intake of processed foods.  . Recommend intake of daily multivitamin, Vitamin D, and calcium.  . Recommend colonoscopy(up to date) for preventive screenings, as well as recommend immunizations that include influenza, TDAP  2. Prediabetes  Chronic, controlled  No current medications  Encouraged to limit intake of sugary foods and drinks  Encouraged to increase physical activity to 150 minutes per week - POCT Urinalysis Dipstick (81002) - POCT UA - Microalbumin - CBC - Hemoglobin A1c  3. Mixed hyperlipidemia  Chronic, controlled  Continue with current medications, tolerating medications well  Advised would need to return in 6 months for follow up to check lipids and cholesterol - CMP14+EGFR - Lipid panel - atorvastatin (LIPITOR) 40 MG tablet; TAKE 1 TABLET(40 MG) BY MOUTH DAILY  Dispense: 90 tablet; Refill: 0  4. Vitamin D deficiency  Will check vitamin D level and supplement as needed.     Also encouraged to spend 15 minutes in the sun daily.  - VITAMIN D 25 Hydroxy (Vit-D Deficiency, Fractures)  5. Class 2 obesity due to excess calories with body mass index (BMI) of 35.0 to 35.9 in adult, unspecified whether serious comorbidity present  Chronic  Discussed healthy diet and regular exercise options   Encouraged to exercise at least 150 minutes per week with 2 days of strength training, increase slowly  6. Encounter for HIV (human immunodeficiency virus) test - HIV Antibody (routine testing w rflx)  7. Impacted cerumen of right ear  Ear lavaged with 1/2  peroxide and 1/2 water with success     Patient was given opportunity to ask questions. Patient verbalized understanding of the plan and was able to repeat key elements of the plan. All questions were answered to their satisfaction.   Teola Bradley, FNP, have reviewed all documentation for this visit. The documentation on 04/04/20 for the exam, diagnosis, procedures, and orders are all accurate and complete.   THE PATIENT IS ENCOURAGED TO PRACTICE SOCIAL DISTANCING DUE TO THE COVID-19 PANDEMIC.

## 2020-04-05 LAB — CMP14+EGFR
ALT: 17 IU/L (ref 0–32)
AST: 24 IU/L (ref 0–40)
Albumin/Globulin Ratio: 1.6 (ref 1.2–2.2)
Albumin: 4.3 g/dL (ref 3.8–4.8)
Alkaline Phosphatase: 82 IU/L (ref 44–121)
BUN/Creatinine Ratio: 14 (ref 12–28)
BUN: 13 mg/dL (ref 8–27)
Bilirubin Total: 1 mg/dL (ref 0.0–1.2)
CO2: 24 mmol/L (ref 20–29)
Calcium: 9.3 mg/dL (ref 8.7–10.3)
Chloride: 103 mmol/L (ref 96–106)
Creatinine, Ser: 0.95 mg/dL (ref 0.57–1.00)
GFR calc Af Amer: 75 mL/min/{1.73_m2} (ref >59)
GFR calc non Af Amer: 65 mL/min/{1.73_m2} (ref >59)
Globulin, Total: 2.7 g/dL (ref 1.5–4.5)
Glucose: 102 mg/dL — ABNORMAL HIGH (ref 65–99)
Potassium: 4 mmol/L (ref 3.5–5.2)
Sodium: 140 mmol/L (ref 134–144)
Total Protein: 7 g/dL (ref 6.0–8.5)

## 2020-04-05 LAB — CBC
Hematocrit: 37.9 % (ref 34.0–46.6)
Hemoglobin: 12.7 g/dL (ref 11.1–15.9)
MCH: 29.1 pg (ref 26.6–33.0)
MCHC: 33.5 g/dL (ref 31.5–35.7)
MCV: 87 fL (ref 79–97)
Platelets: 194 10*3/uL (ref 150–450)
RBC: 4.36 x10E6/uL (ref 3.77–5.28)
RDW: 13.1 % (ref 11.7–15.4)
WBC: 4.3 10*3/uL (ref 3.4–10.8)

## 2020-04-05 LAB — HEMOGLOBIN A1C
Est. average glucose Bld gHb Est-mCnc: 134 mg/dL
Hgb A1c MFr Bld: 6.3 % — ABNORMAL HIGH (ref 4.8–5.6)

## 2020-04-05 LAB — HIV ANTIBODY (ROUTINE TESTING W REFLEX): HIV Screen 4th Generation wRfx: NONREACTIVE

## 2020-04-05 LAB — VITAMIN D 25 HYDROXY (VIT D DEFICIENCY, FRACTURES): Vit D, 25-Hydroxy: 48.9 ng/mL (ref 30.0–100.0)

## 2020-04-09 DIAGNOSIS — H10413 Chronic giant papillary conjunctivitis, bilateral: Secondary | ICD-10-CM | POA: Diagnosis not present

## 2020-04-09 DIAGNOSIS — H2513 Age-related nuclear cataract, bilateral: Secondary | ICD-10-CM | POA: Diagnosis not present

## 2020-04-09 DIAGNOSIS — H524 Presbyopia: Secondary | ICD-10-CM | POA: Diagnosis not present

## 2020-04-25 DIAGNOSIS — Z1231 Encounter for screening mammogram for malignant neoplasm of breast: Secondary | ICD-10-CM | POA: Diagnosis not present

## 2020-06-15 ENCOUNTER — Other Ambulatory Visit: Payer: Self-pay | Admitting: Nurse Practitioner

## 2020-06-15 DIAGNOSIS — E782 Mixed hyperlipidemia: Secondary | ICD-10-CM

## 2020-07-04 ENCOUNTER — Encounter: Payer: Self-pay | Admitting: Nurse Practitioner

## 2020-07-04 DIAGNOSIS — Z01419 Encounter for gynecological examination (general) (routine) without abnormal findings: Secondary | ICD-10-CM | POA: Diagnosis not present

## 2020-09-15 ENCOUNTER — Other Ambulatory Visit: Payer: Self-pay | Admitting: Nurse Practitioner

## 2020-09-15 DIAGNOSIS — E782 Mixed hyperlipidemia: Secondary | ICD-10-CM

## 2020-10-02 ENCOUNTER — Ambulatory Visit: Payer: BC Managed Care – PPO | Admitting: Nurse Practitioner

## 2020-12-11 ENCOUNTER — Ambulatory Visit: Payer: BC Managed Care – PPO | Admitting: Nurse Practitioner

## 2021-01-01 ENCOUNTER — Other Ambulatory Visit: Payer: Self-pay | Admitting: Nurse Practitioner

## 2021-01-01 DIAGNOSIS — E782 Mixed hyperlipidemia: Secondary | ICD-10-CM

## 2021-03-18 ENCOUNTER — Encounter: Payer: Self-pay | Admitting: Nurse Practitioner

## 2021-03-18 ENCOUNTER — Ambulatory Visit
Admission: RE | Admit: 2021-03-18 | Discharge: 2021-03-18 | Disposition: A | Discharge status: Home / Self Care | Payer: BLUE CROSS/BLUE SHIELD | Source: Ambulatory Visit | Attending: Nurse Practitioner | Admitting: Nurse Practitioner

## 2021-03-18 ENCOUNTER — Ambulatory Visit: Payer: BC Managed Care – PPO | Admitting: Nurse Practitioner

## 2021-03-18 ENCOUNTER — Other Ambulatory Visit: Payer: Self-pay

## 2021-03-18 VITALS — BP 112/80 | HR 66 | Temp 98.6°F | Ht 68.4 in | Wt 245.6 lb

## 2021-03-18 DIAGNOSIS — M79622 Pain in left upper arm: Secondary | ICD-10-CM

## 2021-03-18 DIAGNOSIS — E6609 Other obesity due to excess calories: Secondary | ICD-10-CM

## 2021-03-18 DIAGNOSIS — R079 Chest pain, unspecified: Secondary | ICD-10-CM

## 2021-03-18 DIAGNOSIS — R002 Palpitations: Secondary | ICD-10-CM

## 2021-03-18 DIAGNOSIS — E559 Vitamin D deficiency, unspecified: Secondary | ICD-10-CM | POA: Diagnosis not present

## 2021-03-18 DIAGNOSIS — E782 Mixed hyperlipidemia: Secondary | ICD-10-CM

## 2021-03-18 DIAGNOSIS — Z6836 Body mass index (BMI) 36.0-36.9, adult: Secondary | ICD-10-CM

## 2021-03-18 DIAGNOSIS — R7303 Prediabetes: Secondary | ICD-10-CM | POA: Diagnosis not present

## 2021-03-18 DIAGNOSIS — R5383 Other fatigue: Secondary | ICD-10-CM | POA: Diagnosis not present

## 2021-03-18 DIAGNOSIS — Z2821 Immunization not carried out because of patient refusal: Secondary | ICD-10-CM

## 2021-03-18 MED ORDER — OMEPRAZOLE MAGNESIUM 20 MG PO TBEC: 20.0000 mg | DELAYED_RELEASE_TABLET | Freq: Every day | ORAL | 2 refills | Status: DC

## 2021-03-18 NOTE — Patient Instructions (Signed)
Chest Wall Pain °Chest wall pain is pain in or around the bones and muscles of your chest. Chest wall pain may be caused by: °An injury. °Coughing a lot. °Using your chest and arm muscles too much. °Sometimes, the cause may not be known. This pain may take a few weeks or longer to get better. °Follow these instructions at home: °Managing pain, stiffness, and swelling °If told, put ice on the painful area: °Put ice in a plastic bag. °Place a towel between your skin and the bag. °Leave the ice on for 20 minutes, 2-3 times a day. ° °Activity °Rest as told by your doctor. °Avoid doing things that cause pain. This includes lifting heavy items. °Ask your doctor what activities are safe for you. °General instructions ° °Take over-the-counter and prescription medicines only as told by your doctor. °Do not use any products that contain nicotine or tobacco, such as cigarettes, e-cigarettes, and chewing tobacco. If you need help quitting, ask your doctor. °Keep all follow-up visits as told by your doctor. This is important. °Contact a doctor if: °You have a fever. °Your chest pain gets worse. °You have new symptoms. °Get help right away if: °You feel sick to your stomach (nauseous) or you throw up (vomit). °You feel sweaty or light-headed. °You have a cough with mucus from your lungs (sputum) or you cough up blood. °You are short of breath. °These symptoms may be an emergency. Do not wait to see if the symptoms will go away. Get medical help right away. Call your local emergency services (911 in the [place] Do not drive yourself to the hospital. °Summary °Chest wall pain is pain in or around the bones and muscles of your chest. °It may be treated with ice, rest, and medicines. Your condition may also get better if you avoid doing things that cause pain. °Contact a doctor if you have a fever, chest pain that gets worse, or new symptoms. °Get help right away if you feel light-headed or you get short of breath. These symptoms may  be an emergency. °This information is not intended to replace advice given to you by your health care provider. Make sure you discuss any questions you have with your health care provider. °Document Revised: 08/06/2020 Document Reviewed: 07/19/2020 °Elsevier Patient Education © 2022 Elsevier Inc. ° °

## 2021-03-18 NOTE — Progress Notes (Signed)
I,Yamilka J [place]ittleton,acting as a Education administrator for [employer], FNP.,have documented all relevant documentation on the behalf of Minette Brine, FNP,as directed by  Minette Brine, FNP while in the presence of Minette Brine, [place].  This visit occurred during the SARS-CoV-2 public health emergency.  Safety protocols were in place, including screening questions prior to the visit, additional usage of staff PPE, and extensive cleaning of exam room while observing appropriate contact time as indicated for disinfecting solutions.  Subjective:     Patient ID: Carol Gray , female    DOB: Sep 29, 1955 , 66 y.o.   MRN: [id number]   Chief Complaint  Patient presents with   Chest Pain   [place]    HPI  Patient stated she has a mole on her back that has been there for the pat 6-8 months. She stated her massage therapist recommended that she have you look at it.   Felt like a heaviness and pain radiating down her left arm. Denies energy drinks or caffeine. She will have palpitations intermittent, no particular time.   Wt Readings from Last 3 Encounters: 03/18/21 : 245 lb 9.6 oz (111.4 kg) 04/04/20 : 245 lb (111.1 kg) 09/05/19 : 238 lb 12.8 oz (108.3 kg)    Chest Pain  This is a recurrent problem. The current episode started 1 to 4 weeks ago. The problem occurs intermittently. The problem has been unchanged. The pain is moderate. The quality of the pain is described as burning. The pain radiates to the left arm. Associated symptoms include palpitations. Pertinent negatives include no abdominal pain, dizziness or headaches. Associated with: physical activity. She has tried NSAIDs for the symptoms. The treatment provided moderate relief.    Past Medical History:  Diagnosis Date   Abnormal Pap smear    BV (bacterial vaginosis)    h/o   Hypercholesteremia    PMB (postmenopausal bleeding)      Family History  Problem Relation Age of Onset   Cancer Mother        uterine   Hypertension Maternal Uncle     Diabetes Maternal Uncle    Diabetes Maternal Aunt    Cancer Maternal Aunt        ovarian   Diabetes Maternal Grandmother    Cancer Father        lung     Current Outpatient Medications:    atorvastatin (LIPITOR) 40 MG tablet, TAKE 1 TABLET(40 MG) BY MOUTH DAILY, Disp: 90 tablet, Rfl: 0   linaCLOtide (LINZESS PO), Take 1 capsule by mouth as needed., Disp: , Rfl:    omeprazole (PRILOSEC OTC) 20 MG tablet, Take 1 tablet (20 mg total) by mouth daily., Disp: 30 tablet, Rfl: 2   No Known Allergies   Review of Systems  Constitutional: Negative.   Respiratory: Negative.    Cardiovascular:  Positive for chest pain and palpitations. Negative for leg swelling.  Gastrointestinal:  Negative for abdominal pain.  Neurological:  Negative for dizziness and headaches.  Psychiatric/Behavioral: Negative.      Today's Vitals   03/18/21 1014  BP: 112/80  Pulse: 66  Temp: 98.6 F (37 C)  Weight: 245 lb 9.6 oz (111.4 kg)  Height: 5' 8.4" (1.737 m)  PainSc: 0-No pain   Body mass index is 36.91 kg/m.   Objective:  Physical Exam Vitals reviewed.  Constitutional:      Appearance: She is well-developed.  Cardiovascular:     Rate and Rhythm: Normal rate and regular rhythm.  Pulmonary:  Effort: Pulmonary effort is normal. No tachypnea.     Breath sounds: Normal breath sounds. No decreased breath sounds.  Musculoskeletal:     Right lower leg: No edema.     Left lower leg: No edema.  Skin:    General: Skin is warm.     Capillary Refill: Capillary refill takes less than 2 seconds.  Neurological:     General: No focal deficit present.     Mental Status: She is alert.  Psychiatric:        Mood and Affect: Mood normal. Mood is not anxious.        Behavior: Behavior normal.        Assessment And Plan:     1. Chest pain, unspecified type Comments: Chest pain has been ongoing intermittently for the last year. I will refer to Cardiology for further evaluation. ECG reveals nonspecific T  abnormality - EKG 12-Lead - Ambulatory referral to Cardiology - omeprazole (PRILOSEC OTC) 20 MG tablet; Take 1 tablet (20 mg total) by mouth daily.  Dispense: 30 tablet; Refill: 2 - DG Chest 2 View; Future  2. Class 2 obesity due to excess calories with body mass index (BMI) of 36.0 to 36.9 in adult, unspecified whether serious comorbidity present Chronic Discussed healthy diet and regular exercise options  Encouraged to exercise at least 150 minutes per week with 2 days of strength training as tolerated  3. Palpitations Comments: None currently but has had intermittently. Encouraged to limit intake of caffiene as well.  - BMP8+eGFR - CBC - Iron, TIBC and Ferritin Panel  4. Other fatigue Comments: She has had continued fatiuge, will be referring to Cardiology to see if there is a cardiac component. will check for metabolic cause - TSH - CBC - Iron, TIBC and Ferritin Panel  5. Mixed hyperlipidemia Comments: Cholesterol levels are normal, diet controlled  6. Vitamin D deficiency - Lipid panel  7. Prediabetes - Hemoglobin A1c - TSH  8. Tenderness of left axilla Comments: She has tenderness to left axilla, will check ultrasound - [place] AXILLA LEFT; Future  9. Influenza vaccination declined    Patient was given opportunity to ask questions. Patient verbalized understanding of the plan and was able to repeat key elements of the plan. All questions were answered to their satisfaction.  Minette Brine, FNP   I, Minette Brine, FNP, have reviewed all documentation for this visit. The documentation on 03/18/21 for the exam, diagnosis, procedures, and orders are all accurate and complete.   IF YOU HAVE BEEN REFERRED TO A SPECIALIST, IT MAY TAKE 1-2 WEEKS TO SCHEDULE/PROCESS THE REFERRAL. IF YOU HAVE NOT HEARD FROM US/SPECIALIST IN TWO WEEKS, PLEASE GIVE [place] A CALL AT [phone].   THE PATIENT IS ENCOURAGED TO PRACTICE SOCIAL DISTANCING DUE TO THE COVID-19 PANDEMIC.

## 2021-03-19 LAB — BMP8+EGFR
BUN/Creatinine Ratio: 12 (ref 12–28)
BUN: 10 mg/dL (ref 8–27)
CO2: 23 mmol/L (ref 20–29)
Calcium: 9.6 mg/dL (ref 8.7–10.3)
Chloride: 105 mmol/L (ref 96–106)
Creatinine, Ser: 0.86 mg/dL (ref 0.57–1.00)
Glucose: 96 mg/dL (ref 70–99)
Potassium: 4.1 mmol/L (ref 3.5–5.2)
Sodium: 143 mmol/L (ref 134–144)
eGFR: 76 mL/min/{1.73_m2} (ref >59)

## 2021-03-19 LAB — IRON,TIBC AND FERRITIN PANEL
Ferritin: 99 ng/mL (ref 15–150)
Iron Saturation: 34 % (ref 15–55)
Iron: 90 ug/dL (ref 27–139)
Total Iron Binding Capacity: 267 ug/dL (ref 250–450)
UIBC: 177 ug/dL (ref 118–369)

## 2021-03-19 LAB — CBC
Hematocrit: 39 % (ref 34.0–46.6)
Hemoglobin: 13 g/dL (ref 11.1–15.9)
MCH: 28.7 pg (ref 26.6–33.0)
MCHC: 33.3 g/dL (ref 31.5–35.7)
MCV: 86 fL (ref 79–97)
Platelets: 204 10*3/uL (ref 150–450)
RBC: 4.53 x10E6/uL (ref 3.77–5.28)
RDW: 12.5 % (ref 11.7–15.4)
WBC: 4.7 10*3/uL (ref 3.4–10.8)

## 2021-03-19 LAB — HEMOGLOBIN A1C
Est. average glucose Bld gHb Est-mCnc: 137 mg/dL
Hgb A1c MFr Bld: 6.4 % — ABNORMAL HIGH (ref 4.8–5.6)

## 2021-03-19 LAB — LIPID PANEL
Chol/HDL Ratio: 3.1 ratio (ref 0.0–4.4)
Cholesterol, Total: 142 mg/dL (ref 100–199)
HDL: 46 mg/dL (ref >39)
LDL Chol Calc (NIH): 82 mg/dL (ref 0–99)
Triglycerides: 71 mg/dL (ref 0–149)
VLDL Cholesterol Cal: 14 mg/dL (ref 5–40)

## 2021-03-19 LAB — TSH: TSH: 1.15 u[IU]/mL (ref 0.450–4.500)

## 2021-04-04 ENCOUNTER — Other Ambulatory Visit: Payer: Self-pay | Admitting: Nurse Practitioner

## 2021-04-04 ENCOUNTER — Telehealth: Payer: Self-pay | Admitting: Nurse Practitioner

## 2021-04-04 MED ORDER — CEPHALEXIN 500 MG PO CAPS: 500.0000 mg | ORAL_CAPSULE | Freq: Four times a day (QID) | ORAL | 0 refills | Status: AC

## 2021-04-04 NOTE — Telephone Encounter (Signed)
Received on call message patient is having continued burning to her left axilla, left side of chest and left ear. Antibiotic sent to pharmacy and she is to have her ultrasound to her left axilla on Nov 28th at [facility], she has been there previously and wanted all her records at one place. Will have her to follow up after going for Ultrasound.

## 2021-04-08 ENCOUNTER — Encounter: Payer: BC Managed Care – PPO | Admitting: Nurse Practitioner

## 2021-04-14 DIAGNOSIS — N61 Mastitis without abscess: Secondary | ICD-10-CM | POA: Diagnosis not present

## 2021-04-14 DIAGNOSIS — N6489 Other specified disorders of breast: Secondary | ICD-10-CM | POA: Diagnosis not present

## 2021-04-14 DIAGNOSIS — R928 Other abnormal and inconclusive findings on diagnostic imaging of breast: Secondary | ICD-10-CM | POA: Diagnosis not present

## 2021-04-15 ENCOUNTER — Other Ambulatory Visit (HOSPITAL_COMMUNITY)
Admission: RE | Admit: 2021-04-15 | Discharge: 2021-04-15 | Disposition: A | Discharge status: Home / Self Care | Payer: BC Managed Care – PPO | Source: Ambulatory Visit | Attending: Nurse Practitioner | Admitting: Nurse Practitioner

## 2021-04-15 ENCOUNTER — Encounter: Payer: Self-pay | Admitting: Nurse Practitioner

## 2021-04-15 ENCOUNTER — Ambulatory Visit: Payer: BC Managed Care – PPO | Admitting: Nurse Practitioner

## 2021-04-15 ENCOUNTER — Other Ambulatory Visit: Payer: Self-pay

## 2021-04-15 VITALS — BP 124/80 | HR 87 | Temp 98.4°F | Ht 69.25 in | Wt 250.0 lb

## 2021-04-15 DIAGNOSIS — R3911 Hesitancy of micturition: Secondary | ICD-10-CM | POA: Insufficient documentation

## 2021-04-15 DIAGNOSIS — M79622 Pain in left upper arm: Secondary | ICD-10-CM | POA: Diagnosis not present

## 2021-04-15 LAB — POCT URINALYSIS DIPSTICK
Bilirubin, UA: NEGATIVE
Glucose, UA: NEGATIVE
Ketones, UA: NEGATIVE
Leukocytes, UA: NEGATIVE
Nitrite, UA: NEGATIVE
Protein, UA: NEGATIVE
Spec Grav, UA: 1.01 (ref 1.010–1.025)
Urobilinogen, UA: 0.2 E.U./dL
pH, UA: 7 (ref 5.0–8.0)

## 2021-04-15 LAB — HM MAMMOGRAPHY: HM Mammogram: NORMAL (ref 0–4)

## 2021-04-15 MED ORDER — SULFAMETHOXAZOLE-TRIMETHOPRIM 800-160 MG PO TABS: 1.0000 | ORAL_TABLET | Freq: Two times a day (BID) | ORAL | 0 refills | Status: DC

## 2021-04-15 NOTE — Patient Instructions (Signed)
Urinary Tract Infection, Adult A urinary tract infection (UTI) is an infection of any part of the urinary tract. The urinary tract includes the kidneys, ureters, bladder, and urethra. These organs make, store, and get rid of urine in the body. An upper UTI affects the ureters and kidneys. A lower UTI affects the bladder and urethra. What are the causes? Most urinary tract infections are caused by bacteria in your genital area around your urethra, where urine leaves your body. These bacteria grow and cause inflammation of your urinary tract. What increases the risk? You are more likely to develop this condition if: You have a urinary catheter that stays in place. You are not able to control when you urinate or have a bowel movement (incontinence). You are female and you: Use a spermicide or diaphragm for birth control. Have low estrogen levels. Are pregnant. You have certain genes that increase your risk. You are sexually active. You take antibiotic medicines. You have a condition that causes your flow of urine to slow down, such as: An enlarged prostate, if you are female. Blockage in your urethra. A kidney stone. A nerve condition that affects your bladder control (neurogenic bladder). Not getting enough to drink, or not urinating often. You have certain medical conditions, such as: Diabetes. A weak disease-fighting system (immunesystem). Sickle cell disease. Gout. Spinal cord injury. What are the signs or symptoms? Symptoms of this condition include: Needing to urinate right away (urgency). Frequent urination. This may include small amounts of urine each time you urinate. Pain or burning with urination. Blood in the urine. Urine that smells bad or unusual. Trouble urinating. Cloudy urine. Vaginal discharge, if you are female. Pain in the abdomen or the lower back. You may also have: Vomiting or a decreased appetite. Confusion. Irritability or tiredness. A fever or  chills. Diarrhea. The first symptom in older adults may be confusion. In some cases, they may not have any symptoms until the infection has worsened. How is this diagnosed? This condition is diagnosed based on your medical history and a physical exam. You may also have other tests, including: Urine tests. Blood tests. Tests for STIs (sexually transmitted infections). If you have had more than one UTI, a cystoscopy or imaging studies may be done to determine the cause of the infections. How is this treated? Treatment for this condition includes: Antibiotic medicine. Over-the-counter medicines to treat discomfort. Drinking enough water to stay hydrated. If you have frequent infections or have other conditions such as a kidney stone, you may need to see a health care provider who specializes in the urinary tract (urologist). In rare cases, urinary tract infections can cause sepsis. Sepsis is a life-threatening condition that occurs when the body responds to an infection. Sepsis is treated in the hospital with IV antibiotics, fluids, and other medicines. Follow these instructions at home: Medicines Take over-the-counter and prescription medicines only as told by your health care provider. If you were prescribed an antibiotic medicine, take it as told by your health care provider. Do not stop using the antibiotic even if you start to feel better. General instructions Make sure you: Empty your bladder often and completely. Do not hold urine for long periods of time. Empty your bladder after sex. Wipe from front to back after urinating or having a bowel movement if you are female. Use each tissue only one time when you wipe. Drink enough fluid to keep your urine pale yellow. Keep all follow-up visits. This is important. Contact a health care provider   if: Your symptoms do not get better after 1-2 days. Your symptoms go away and then return. Get help right away if: You have severe pain in your  back or your lower abdomen. You have a fever or chills. You have nausea or vomiting. Summary A urinary tract infection (UTI) is an infection of any part of the urinary tract, which includes the kidneys, ureters, bladder, and urethra. Most urinary tract infections are caused by bacteria in your genital area. Treatment for this condition often includes antibiotic medicines. If you were prescribed an antibiotic medicine, take it as told by your health care provider. Do not stop using the antibiotic even if you start to feel better. Keep all follow-up visits. This is important. This information is not intended to replace advice given to you by your health care provider. Make sure you discuss any questions you have with your health care provider. Document Revised: 12/15/2019 Document Reviewed: 12/15/2019 Elsevier Patient Education  2022 Elsevier Inc.  

## 2021-04-15 NOTE — Progress Notes (Signed)
I,Katawbba Wiggins,acting as a Neurosurgeon for [employer], FNP.,have documented all relevant documentation on the behalf of Arnette Felts, FNP,as directed by  Arnette Felts, FNP while in the presence of Arnette Felts, FNP.   This visit occurred during the SARS-CoV-2 public health emergency.  Safety protocols were in place, including screening questions prior to the visit, additional usage of staff PPE, and extensive cleaning of exam room while observing appropriate contact time as indicated for disinfecting solutions.  Subjective:     Patient ID: Carol Gray , female    DOB: [birth year] , 65 y.o.   MRN: [id number]   Chief Complaint  Patient presents with   Urinary Tract Infection    HPI  She is having urinary symptoms with feeling like she has to go again after she goes. Denies frequency. Denies dysuria. Symptoms began yesterday. Denies abdomen pain or fever. She has had a urinary tract infection in the past. She noticed a discharge vaginal thick white in color a few weeks ago but went away.  She is sexually active.     Past Medical History:  Diagnosis Date   Abnormal Pap smear    BV (bacterial vaginosis)    h/o   Hypercholesteremia    PMB (postmenopausal bleeding)      Family History  Problem Relation Age of Onset   Cancer Mother        uterine   Hypertension Maternal Uncle    Diabetes Maternal Uncle    Diabetes Maternal Aunt    Cancer Maternal Aunt        ovarian   Diabetes Maternal Grandmother    Cancer Father        lung     Current Outpatient Medications:    atorvastatin (LIPITOR) 40 MG tablet, TAKE 1 TABLET(40 MG) BY MOUTH DAILY, Disp: 90 tablet, Rfl: 0   omeprazole (PRILOSEC OTC) 20 MG tablet, Take 1 tablet (20 mg total) by mouth daily., Disp: 30 tablet, Rfl: 2   sulfamethoxazole-trimethoprim (BACTRIM DS) 800-160 MG tablet, Take 1 tablet by mouth 2 (two) times daily., Disp: 10 tablet, Rfl: 0   No Known Allergies   Review of Systems  Constitutional: Negative.    Respiratory: Negative.    Cardiovascular: Negative.   Genitourinary:  Negative for decreased urine volume, dysuria, flank pain, frequency and urgency.       Urine hesitancy post void  Neurological:  Negative for dizziness and headaches.  Psychiatric/Behavioral: Negative.      Today's Vitals   04/15/21 1419  BP: 124/80  Pulse: 87  Temp: 98.4 F (36.9 C)  Weight: 250 lb (113.4 kg)  Height: 5' 9.25" (1.759 m)   Body mass index is 36.65 kg/m.  Wt Readings from Last 3 Encounters:  04/15/21 250 lb (113.4 kg)  03/18/21 245 lb 9.6 oz (111.4 kg)  04/04/20 245 lb (111.1 kg)    BP Readings from Last 3 Encounters:  04/15/21 124/80  03/18/21 112/80  04/04/20 128/76    Objective:  Physical Exam      Assessment And Plan:     1. Hesitancy Comments: Urine is normal except trace blood. Will send urine culture and she will take bactrim if comes back with bacteria or symptoms are worsened. - POCT Urinalysis Dipstick (81002) - Culture, Urine - sulfamethoxazole-trimethoprim (BACTRIM DS) 800-160 MG tablet; Take 1 tablet by mouth 2 (two) times daily.  Dispense: 10 tablet; Refill: 0 - Urine cytology ancillary only  2. Tenderness of left axilla Comments: Reports her pain has improved  since taking cephalexin, awaiting ultrasound results from [facility]    Patient was given opportunity to ask questions. Patient verbalized understanding of the plan and was able to repeat key elements of the plan. All questions were answered to their satisfaction.  Arnette Felts, FNP   I, Arnette Felts, FNP, have reviewed all documentation for this visit. The documentation on 04/15/21 for the exam, diagnosis, procedures, and orders are all accurate and complete.   IF YOU HAVE BEEN REFERRED TO A SPECIALIST, IT MAY TAKE 1-2 WEEKS TO SCHEDULE/PROCESS THE REFERRAL. IF YOU HAVE NOT HEARD FROM US/SPECIALIST IN TWO WEEKS, PLEASE GIVE [place] A CALL AT [phone].   THE PATIENT IS ENCOURAGED TO PRACTICE SOCIAL DISTANCING  DUE TO THE COVID-19 PANDEMIC.

## 2021-04-18 ENCOUNTER — Encounter: Payer: Self-pay | Admitting: Nurse Practitioner

## 2021-04-21 ENCOUNTER — Telehealth: Payer: Self-pay

## 2021-04-21 NOTE — Telephone Encounter (Signed)
I left the pt a message that [facility] needed her to come back to the office to give another urine sample. [facility] said that it came in a grey top tube.

## 2021-04-22 DIAGNOSIS — R3911 Hesitancy of micturition: Secondary | ICD-10-CM | POA: Diagnosis not present

## 2021-04-23 LAB — URINE CYTOLOGY ANCILLARY ONLY
Bacterial Vaginitis-Urine: NEGATIVE
Candida Urine: NEGATIVE
Chlamydia: NEGATIVE
Comment: NEGATIVE
Comment: NEGATIVE
Comment: NORMAL
Neisseria Gonorrhea: NEGATIVE
Trichomonas: NEGATIVE

## 2021-04-24 LAB — URINE CULTURE

## 2021-04-28 ENCOUNTER — Ambulatory Visit: Payer: BC Managed Care – PPO | Admitting: Internal Medicine

## 2021-04-29 ENCOUNTER — Ambulatory Visit: Payer: BC Managed Care – PPO | Admitting: Internal Medicine

## 2021-07-03 ENCOUNTER — Other Ambulatory Visit: Payer: Self-pay

## 2021-07-03 DIAGNOSIS — E782 Mixed hyperlipidemia: Secondary | ICD-10-CM

## 2021-07-03 MED ORDER — ATORVASTATIN CALCIUM 40 MG PO TABS: 40.0000 mg | ORAL_TABLET | Freq: Every day | ORAL | 0 refills | Status: DC

## 2021-07-08 DIAGNOSIS — Z124 Encounter for screening for malignant neoplasm of cervix: Secondary | ICD-10-CM | POA: Diagnosis not present

## 2021-07-08 DIAGNOSIS — Z01419 Encounter for gynecological examination (general) (routine) without abnormal findings: Secondary | ICD-10-CM | POA: Diagnosis not present

## 2021-07-08 LAB — HM PAP SMEAR: HM Pap smear: NORMAL

## 2021-07-14 LAB — HM MAMMOGRAPHY

## 2021-07-16 ENCOUNTER — Encounter: Payer: Self-pay | Admitting: Nurse Practitioner

## 2021-07-22 ENCOUNTER — Other Ambulatory Visit: Payer: Self-pay

## 2021-07-22 ENCOUNTER — Encounter: Payer: BC Managed Care – PPO | Admitting: Nurse Practitioner

## 2021-07-22 ENCOUNTER — Encounter: Payer: Self-pay | Admitting: Nurse Practitioner

## 2021-07-22 ENCOUNTER — Ambulatory Visit (INDEPENDENT_AMBULATORY_CARE_PROVIDER_SITE_OTHER): Payer: BC Managed Care – PPO | Admitting: Nurse Practitioner

## 2021-07-22 VITALS — BP 138/80 | HR 76 | Temp 97.9°F | Ht 69.25 in | Wt 248.7 lb

## 2021-07-22 DIAGNOSIS — E782 Mixed hyperlipidemia: Secondary | ICD-10-CM | POA: Diagnosis not present

## 2021-07-22 DIAGNOSIS — R030 Elevated blood-pressure reading, without diagnosis of hypertension: Secondary | ICD-10-CM

## 2021-07-22 DIAGNOSIS — R7303 Prediabetes: Secondary | ICD-10-CM | POA: Diagnosis not present

## 2021-07-22 DIAGNOSIS — E559 Vitamin D deficiency, unspecified: Secondary | ICD-10-CM

## 2021-07-22 DIAGNOSIS — Z2821 Immunization not carried out because of patient refusal: Secondary | ICD-10-CM

## 2021-07-22 DIAGNOSIS — Z0000 Encounter for general adult medical examination without abnormal findings: Secondary | ICD-10-CM | POA: Diagnosis not present

## 2021-07-22 DIAGNOSIS — E6609 Other obesity due to excess calories: Secondary | ICD-10-CM

## 2021-07-22 DIAGNOSIS — Z6836 Body mass index (BMI) 36.0-36.9, adult: Secondary | ICD-10-CM

## 2021-07-22 MED ORDER — ATORVASTATIN CALCIUM 40 MG PO TABS: 40.0000 mg | ORAL_TABLET | Freq: Every day | ORAL | 1 refills | Status: DC

## 2021-07-22 NOTE — Progress Notes (Unsigned)
Jeri Cos [place],acting as a Neurosurgeon for Arnette Felts, FNP.,have documented all relevant documentation on the behalf of Arnette Felts, FNP,as directed by  Arnette Felts, FNP while in the presence of Arnette Felts, FNP.  This visit occurred during the SARS-CoV-2 public health emergency.  Safety protocols were in place, including screening questions prior to the visit, additional usage of staff PPE, and extensive cleaning of exam room while observing appropriate contact time as indicated for disinfecting solutions.  Subjective:     Patient ID: Carol Gray , female    DOB: 08/05/1955 , 66 y.o.   MRN: [id number]   Chief Complaint  Patient presents with   Annual Exam    HPI  Patient here for HM.  She sees Dr. Normand Sloop for GYN.    Wt Readings from Last 3 Encounters: 07/22/21 : 248 lb 11.2 oz (112.8 kg) 04/15/21 : 250 lb (113.4 kg) 03/18/21 : 245 lb 9.6 oz (111.4 kg)      Past Medical History:  Diagnosis Date   Abnormal Pap smear    BV (bacterial vaginosis)    h/o   Hypercholesteremia    PMB (postmenopausal bleeding)      Family History  Problem Relation Age of Onset   Cancer Mother        uterine   Hypertension Maternal Uncle    Diabetes Maternal Uncle    Diabetes Maternal Aunt    Cancer Maternal Aunt        ovarian   Diabetes Maternal Grandmother    Cancer Father        lung     Current Outpatient Medications:    atorvastatin (LIPITOR) 40 MG tablet, Take 1 tablet (40 mg total) by mouth daily., Disp: 90 tablet, Rfl: 0   VITAMIN D PO, Take 1 tablet by mouth daily at 2 am., Disp: , Rfl:    No Known Allergies    The patient states she uses post menopausal status for birth control. Last LMP was No LMP recorded. Patient is postmenopausal.. Negative for Dysmenorrhea and Negative for Menorrhagia. Negative for: breast discharge, breast lump(s), breast pain and breast self exam. Associated symptoms include abnormal vaginal bleeding. Pertinent negatives include abnormal  bleeding (hematology), anxiety, decreased libido, depression, difficulty falling sleep, dyspareunia, history of infertility, nocturia, sexual dysfunction, sleep disturbances, urinary incontinence, urinary urgency, vaginal discharge and vaginal itching. Diet regular. The patient states her exercise level is 2 days a week for one hour each.   The patient's tobacco use is:  Social History   Tobacco Use  Smoking Status Never  Smokeless Tobacco Never  . She has been exposed to passive smoke. The patient's alcohol use is:  Social History   Substance and Sexual Activity  Alcohol Use No  Additional information: Last pap 06/24/2020, next one scheduled for reported had one done this year.    Review of Systems  Constitutional: Negative.   HENT: Negative.    Eyes: Negative.   Respiratory: Negative.    Cardiovascular: Negative.   Gastrointestinal: Negative.   Endocrine: Negative.   Genitourinary: Negative.   Musculoskeletal: Negative.   Skin: Negative.   Allergic/Immunologic: Negative.   Neurological: Negative.   Hematological: Negative.   Psychiatric/Behavioral: Negative.      Today's Vitals   07/22/21 1048  BP: 138/80  Pulse: 76  Temp: 97.9 F (36.6 C)  Weight: 248 lb 11.2 oz (112.8 kg)  Height: 5' 9.25" (1.759 m)  PainSc: 0-No pain   Body mass index is 36.46 kg/m.  Objective:  Physical Exam Vitals reviewed.  Constitutional:      General: She is not in acute distress.    Appearance: Normal appearance. She is well-developed. She is obese.  HENT:     Head: Normocephalic and atraumatic.     Right Ear: Hearing, tympanic membrane, ear canal and external ear normal. There is no impacted cerumen.     Left Ear: Hearing, tympanic membrane, ear canal and external ear normal. There is no impacted cerumen.     Nose:     Comments: Deferred - masked    Mouth/Throat:     Comments: Deferred - masked Eyes:     General: Lids are normal.     Extraocular Movements: Extraocular movements  intact.     Conjunctiva/sclera: Conjunctivae normal.     Pupils: Pupils are equal, round, and reactive to light.     Funduscopic exam:    Right eye: No papilledema.        Left eye: No papilledema.  Neck:     Thyroid: No thyroid mass.     Vascular: No carotid bruit.  Cardiovascular:     Rate and Rhythm: Normal rate and regular rhythm.     Pulses: Normal pulses.     Heart sounds: Normal heart sounds. No murmur heard. Pulmonary:     Effort: Pulmonary effort is normal. No respiratory distress.     Breath sounds: Normal breath sounds. No wheezing.  Chest:     Chest wall: No mass.  Breasts:    Tanner Score is 5.     Right: Normal. No mass or tenderness.     Left: Normal. No mass or tenderness.  Abdominal:     General: Abdomen is flat. Bowel sounds are normal. There is no distension.     Palpations: Abdomen is soft.     Tenderness: There is no abdominal tenderness.  Genitourinary:    Comments: Deferred - sees GYN Musculoskeletal:        General: No swelling or tenderness. Normal range of motion.     Cervical back: Full passive range of motion without pain, normal range of motion and neck supple.     Right lower leg: No edema.     Left lower leg: No edema.  Lymphadenopathy:     Upper Body:     Right upper body: No supraclavicular, axillary or pectoral adenopathy.     Left upper body: No supraclavicular, axillary or pectoral adenopathy.  Skin:    General: Skin is warm and dry.     Capillary Refill: Capillary refill takes less than 2 seconds.  Neurological:     General: No focal deficit present.     Mental Status: She is alert and oriented to person, place, and time.     Cranial Nerves: No cranial nerve deficit.     Sensory: No sensory deficit.  Psychiatric:        Mood and Affect: Mood normal.        Behavior: Behavior normal.        Thought Content: Thought content normal.        Judgment: Judgment normal.        Assessment And Plan:     1. Encounter for general adult  medical examination w/o abnormal findings  2. Herpes zoster vaccination declined  3. Mixed hyperlipidemia  4. Prediabetes  5. Vitamin D deficiency  6. Class 2 obesity due to excess calories with body mass index (BMI) of 36.0 to 36.9 in adult, unspecified whether serious  comorbidity present     Patient was given opportunity to ask questions. Patient verbalized understanding of the plan and was able to repeat key elements of the plan. All questions were answered to their satisfaction.   Arnette Felts, FNP   I, Arnette Felts, FNP, have reviewed all documentation for this visit. The documentation on 07/22/21 for the exam, diagnosis, procedures, and orders are all accurate and complete.  THE PATIENT IS ENCOURAGED TO PRACTICE SOCIAL DISTANCING DUE TO THE COVID-19 PANDEMIC.

## 2021-07-22 NOTE — Patient Instructions (Signed)

## 2021-07-23 LAB — CMP14+EGFR
ALT: 21 IU/L (ref 0–32)
AST: 29 IU/L (ref 0–40)
Albumin/Globulin Ratio: 1.9 (ref 1.2–2.2)
Albumin: 4.5 g/dL (ref 3.8–4.8)
Alkaline Phosphatase: 87 IU/L (ref 44–121)
BUN/Creatinine Ratio: 16 (ref 12–28)
BUN: 13 mg/dL (ref 8–27)
Bilirubin Total: 0.7 mg/dL (ref 0.0–1.2)
CO2: 24 mmol/L (ref 20–29)
Calcium: 9.5 mg/dL (ref 8.7–10.3)
Chloride: 106 mmol/L (ref 96–106)
Creatinine, Ser: 0.83 mg/dL (ref 0.57–1.00)
Globulin, Total: 2.4 g/dL (ref 1.5–4.5)
Glucose: 100 mg/dL — ABNORMAL HIGH (ref 70–99)
Potassium: 3.9 mmol/L (ref 3.5–5.2)
Sodium: 144 mmol/L (ref 134–144)
Total Protein: 6.9 g/dL (ref 6.0–8.5)
eGFR: 79 mL/min/{1.73_m2} (ref >59)

## 2021-07-23 LAB — LIPID PANEL
Chol/HDL Ratio: 2.9 ratio (ref 0.0–4.4)
Cholesterol, Total: 136 mg/dL (ref 100–199)
HDL: 47 mg/dL (ref >39)
LDL Chol Calc (NIH): 74 mg/dL (ref 0–99)
Triglycerides: 75 mg/dL (ref 0–149)
VLDL Cholesterol Cal: 15 mg/dL (ref 5–40)

## 2021-07-23 LAB — CBC
Hematocrit: 37.7 % (ref 34.0–46.6)
Hemoglobin: 12.8 g/dL (ref 11.1–15.9)
MCH: 29.1 pg (ref 26.6–33.0)
MCHC: 34 g/dL (ref 31.5–35.7)
MCV: 86 fL (ref 79–97)
Platelets: 223 10*3/uL (ref 150–450)
RBC: 4.4 x10E6/uL (ref 3.77–5.28)
RDW: 13 % (ref 11.7–15.4)
WBC: 4.3 10*3/uL (ref 3.4–10.8)

## 2021-07-23 LAB — VITAMIN D 25 HYDROXY (VIT D DEFICIENCY, FRACTURES): Vit D, 25-Hydroxy: 48.2 ng/mL (ref 30.0–100.0)

## 2021-07-23 LAB — HEMOGLOBIN A1C
Est. average glucose Bld gHb Est-mCnc: 134 mg/dL
Hgb A1c MFr Bld: 6.3 % — ABNORMAL HIGH (ref 4.8–5.6)

## 2021-07-28 ENCOUNTER — Encounter: Payer: Self-pay | Admitting: Nurse Practitioner

## 2021-07-30 ENCOUNTER — Other Ambulatory Visit: Payer: Self-pay | Admitting: Nurse Practitioner

## 2021-07-30 DIAGNOSIS — R7303 Prediabetes: Secondary | ICD-10-CM

## 2021-07-30 MED ORDER — METFORMIN HCL 500 MG PO TABS
500.0000 mg | ORAL_TABLET | Freq: Two times a day (BID) | ORAL | 2 refills | Status: DC
Start: 2021-07-30 — End: 2022-03-10

## 2022-01-28 ENCOUNTER — Ambulatory Visit: Payer: BC Managed Care – PPO | Admitting: Internal Medicine

## 2022-01-29 ENCOUNTER — Other Ambulatory Visit: Payer: Self-pay

## 2022-01-29 DIAGNOSIS — E782 Mixed hyperlipidemia: Secondary | ICD-10-CM

## 2022-01-29 MED ORDER — ATORVASTATIN CALCIUM 40 MG PO TABS: 40.0000 mg | ORAL_TABLET | Freq: Every day | ORAL | 1 refills | Status: DC

## 2022-02-13 DIAGNOSIS — M5451 Vertebrogenic low back pain: Secondary | ICD-10-CM | POA: Diagnosis not present

## 2022-02-22 DIAGNOSIS — R300 Dysuria: Secondary | ICD-10-CM | POA: Diagnosis not present

## 2022-02-22 DIAGNOSIS — J019 Acute sinusitis, unspecified: Secondary | ICD-10-CM | POA: Diagnosis not present

## 2022-02-22 DIAGNOSIS — R200 Anesthesia of skin: Secondary | ICD-10-CM | POA: Diagnosis not present

## 2022-02-26 ENCOUNTER — Encounter: Payer: Self-pay | Admitting: Neurology

## 2022-03-04 NOTE — Progress Notes (Signed)
Initial neurology clinic note  Carol Gray MRN: [id number] DOB: August 24, 1955  Referring provider: Marva Panda, NP  Primary care provider: Dorothyann Peng, MD  Reason for consult:  intermittent numbness of face, arms, and legs  Subjective:  [name], a 66 y.o. female with a medical history of HLD, pre-diabetes who presents to neurology clinic with intermittent numbness of face, arms, and legs. The patient is alone today.  Patient first noticed symptoms about 4 weeks ago with numbness in her face, arms, and legs. It is on both sides. It feels like she has been sitting in the same position for a long time. It comes and goes and can last a few minutes to 30 minutes. The symptoms come and go. She saw ortho for low back pain and leg numbness who gave her some steroids, which did not help. She was also given some exercises that helped some as well. Patient noticed she was off balance. PCP noticed some congestion/fluid in her ear and gave her an antibiotic. Her imbalance has improved. She has noticed improvement in the numbness symptoms as well.  She also endorses burning pain from nose around head into back of head and into her neck. This is also intermittent. She has a lot of congestion that does not improve with medication.  Patient also endorses difficulty sleeping (sleeping 2-3 hours per night). She stays up late at night for the last few months. She states she previously would get 4-5 hours per night. She wonders if she has been extra stressed. She does not think she snores significantly. She feels tired throughout the day.  EtOH use: never Restrictive diet: No Family history of neurologic disease: none  She takes a vitamin D, calcium, and One a Day womens vitamins.  MEDICATIONS:  Outpatient Encounter Medications as of 03/10/2022  Medication Sig   atorvastatin (LIPITOR) 40 MG tablet Take 1 tablet (40 mg total) by mouth daily.   VITAMIN D PO Take 2,000 Int'l  Units by mouth daily at 2 am.   [DISCONTINUED] metFORMIN (GLUCOPHAGE) 500 MG tablet Take 1 tablet (500 mg total) by mouth 2 (two) times daily with a meal.   No facility-administered encounter medications on file as of 03/10/2022.    PAST MEDICAL HISTORY: Past Medical History:  Diagnosis Date   Abnormal Pap smear    BV (bacterial vaginosis)    h/o   Hypercholesteremia    PMB (postmenopausal bleeding)     PAST SURGICAL HISTORY: History reviewed. No pertinent surgical history.  ALLERGIES: No Known Allergies  FAMILY HISTORY: Family History  Problem Relation Age of Onset   Cancer Mother        uterine   Hypertension Maternal Uncle    Diabetes Maternal Uncle    Diabetes Maternal Aunt    Cancer Maternal Aunt        ovarian   Diabetes Maternal Grandmother    Cancer Father        lung    SOCIAL HISTORY: Social History   Tobacco Use   Smoking status: Never   Smokeless tobacco: Never  Vaping Use   Vaping Use: Never used  Substance Use Topics   Alcohol use: No   Drug use: No   Social History   Social History Narrative   Lives with husband in a one story home   Caffeine - occas   Right handed    Objective:  Vital Signs:  BP 131/81   Pulse 71   Ht 5'  10" (1.778 m)   Wt 243 lb (110.2 kg)   SpO2 97%   BMI 34.87 kg/m   General: No acute distress.  Patient appears well-groomed.   Head:  Normocephalic/atraumatic Neck: supple, no paraspinal tenderness, full range of motion  Neurological Exam: Mental status: alert and oriented, speech fluent and not dysarthric, language intact.  Cranial nerves: CN I: not tested CN II: pupils equal, round and reactive to light, visual fields intact CN III, IV, VI:  full range of motion, no nystagmus, no ptosis CN V: facial sensation intact. CN VII: upper and lower face symmetric CN VIII: hearing intact CN IX, X: gag intact, uvula midline CN XI: sternocleidomastoid and trapezius muscles intact CN XII: tongue midline  Bulk  & Tone: normal Motor:  muscle strength 5/5 throughout Deep Tendon Reflexes:  2+ throughout,  toes downgoing. Positive hoffman and tromner (R > L)   Sensation:  Pinprick, temperature, proprioception, and vibratory sensation intact. Finger to nose testing:  Without dysmetria.  Gait:  Normal station and stride.  Romberg negative.   Labs and Imaging review: Internal labs: Lab Results  Component Value Date   HGBA1C 6.3 (H) 07/22/2021   Lab Results  Component Value Date   [id number] 478 09/05/2019   Lab Results  Component Value Date   TSH 1.150 03/18/2021   No results found for: "ESRSEDRATE", "POCTSEDRATE"  Vit D (07/22/21): 48.2   External labs: Normal or unremarkable: TSH, CBC, CMP B12 (02/22/22): 568  Assessment/Plan:  Carol Gray is a 66 y.o. female who presents for evaluation of intermittent numbness of bilateral face, arms, and legs. She has a relevant medical history of prediabetes, HLD. Her neurological examination is essentially normal. Available diagnostic data is significant for B12 of 568. The etiology of patient's symptoms is not currently clear. A vitamin deficiency is certainly a possibility, so I will check some additional lab work today. She also has very poor sleep and insomnia, which may be contributing. In any case, she would benefit from a sleep medicine consultation.  PLAN: -Blood work: B1, B6, MMA, copper -Referral to sleep medicine for insomnia  -Return to clinic in 6 months or sooner if needed  The impression above as well as the plan as outlined below were extensively discussed with the patient who voiced understanding. All questions were answered to their satisfaction.  When available, results of the above investigations and possible further recommendations will be communicated to the patient via telephone/MyChart. Patient to call office if not contacted after expected testing turnaround time.   Total time spent reviewing records, interview,  history/exam, documentation, and coordination of care on day of encounter:  35 min   Thank you for allowing me to participate in patient's care.  If I can answer any additional questions, I would be pleased to do so.  Carol Levins, MD   CC: Carol Gray, [place] [address] [id number]  CC: Referring provider: Everardo Gray, [address] [place],  [place] [address]

## 2022-03-10 ENCOUNTER — Other Ambulatory Visit (INDEPENDENT_AMBULATORY_CARE_PROVIDER_SITE_OTHER): Payer: BC Managed Care – PPO

## 2022-03-10 ENCOUNTER — Ambulatory Visit: Payer: BC Managed Care – PPO | Admitting: Neurology

## 2022-03-10 ENCOUNTER — Encounter: Payer: Self-pay | Admitting: Neurology

## 2022-03-10 VITALS — BP 131/81 | HR 71 | Ht 70.0 in | Wt 243.0 lb

## 2022-03-10 DIAGNOSIS — G4700 Insomnia, unspecified: Secondary | ICD-10-CM

## 2022-03-10 DIAGNOSIS — R200 Anesthesia of skin: Secondary | ICD-10-CM

## 2022-03-10 DIAGNOSIS — R209 Unspecified disturbances of skin sensation: Secondary | ICD-10-CM

## 2022-03-10 DIAGNOSIS — G478 Other sleep disorders: Secondary | ICD-10-CM | POA: Diagnosis not present

## 2022-03-10 NOTE — Addendum Note (Signed)
Addended by: Renae Gloss on: 03/10/2022 12:05 PM   Modules accepted: Orders

## 2022-03-10 NOTE — Patient Instructions (Signed)
I would like to get additional lab work today to look for potential problems with vitamins.  I would also like you to see a sleep medicine specialist for your very poor sleep.  I would like to see you back in clinic in 6 months or sooner if needed. Please let me know if you have new or worsening symptoms.  The physicians and staff at [facility] are committed to providing excellent care. You may receive a survey requesting feedback about your experience at our office. We strive to receive "very good" responses to the survey questions. If you feel that your experience would prevent you from giving the office a "very good " response, please contact our office to try to remedy the situation. We may be reached at [phone]. Thank you for taking the time out of your busy day to complete the survey.  Kai Levins, MD [facility]

## 2022-03-15 LAB — METHYLMALONIC ACID, SERUM: Methylmalonic Acid, Quant: 146 nmol/L (ref 87–318)

## 2022-03-15 LAB — COPPER, SERUM: Copper: 118 ug/dL (ref 70–175)

## 2022-03-15 LAB — VITAMIN B1: Vitamin B1 (Thiamine): 8 nmol/L (ref 8–30)

## 2022-03-15 LAB — VITAMIN B6: Vitamin B6: 13.1 ng/mL (ref 2.1–21.7)

## 2022-04-02 ENCOUNTER — Ambulatory Visit: Payer: BC Managed Care – PPO | Admitting: Internal Medicine

## 2022-04-16 DIAGNOSIS — Z1231 Encounter for screening mammogram for malignant neoplasm of breast: Secondary | ICD-10-CM | POA: Diagnosis not present

## 2022-04-29 ENCOUNTER — Ambulatory Visit: Payer: BC Managed Care – PPO | Admitting: Neurology

## 2022-06-25 DIAGNOSIS — R350 Frequency of micturition: Secondary | ICD-10-CM | POA: Diagnosis not present

## 2022-06-25 DIAGNOSIS — Z79890 Hormone replacement therapy (postmenopausal): Secondary | ICD-10-CM | POA: Diagnosis not present

## 2022-06-25 DIAGNOSIS — N819 Female genital prolapse, unspecified: Secondary | ICD-10-CM | POA: Diagnosis not present

## 2022-07-24 ENCOUNTER — Encounter: Payer: BC Managed Care – PPO | Admitting: Internal Medicine

## 2022-07-30 ENCOUNTER — Encounter: Payer: BC Managed Care – PPO | Admitting: Internal Medicine

## 2022-08-27 DIAGNOSIS — N3281 Overactive bladder: Secondary | ICD-10-CM | POA: Diagnosis not present

## 2022-08-27 DIAGNOSIS — Z01419 Encounter for gynecological examination (general) (routine) without abnormal findings: Secondary | ICD-10-CM | POA: Diagnosis not present

## 2022-08-27 DIAGNOSIS — Z79890 Hormone replacement therapy (postmenopausal): Secondary | ICD-10-CM | POA: Diagnosis not present

## 2022-08-28 NOTE — Progress Notes (Deleted)
NEUROLOGY FOLLOW UP OFFICE NOTE  Carol Gray [id number]  Subjective:  Carol Gray is a 67 y.o. year old female with a history of HLD, pre-diabetes who we last saw on 03/10/22.  To briefly review: Patient first noticed symptoms about 4 weeks ago with numbness in her face, arms, and legs. It is on both sides. It feels like she has been sitting in the same position for a long time. It comes and goes and can last a few minutes to 30 minutes. The symptoms come and go. She saw ortho for low back pain and leg numbness who gave her some steroids, which did not help. She was also given some exercises that helped some as well. Patient noticed she was off balance. PCP noticed some congestion/fluid in her ear and gave her an antibiotic. Her imbalance has improved. She has noticed improvement in the numbness symptoms as well.   She also endorses burning pain from nose around head into back of head and into her neck. This is also intermittent. She has a lot of congestion that does not improve with medication.   Patient also endorses difficulty sleeping (sleeping 2-3 hours per night). She stays up late at night for the last few months. She states she previously would get 4-5 hours per night. She wonders if she has been extra stressed. She does not think she snores significantly. She feels tired throughout the day.   EtOH use: never Restrictive diet: No Family history of neurologic disease: none   She takes a vitamin D, calcium, and One a Day womens vitamins.  Most recent Assessment and Plan (03/10/22): Her neurological examination is essentially normal. Available diagnostic data is significant for B12 of 568. The etiology of patient's symptoms is not currently clear. A vitamin deficiency is certainly a possibility, so I will check some additional lab work today. She also has very poor sleep and insomnia, which may be contributing. In any case, she would benefit from a sleep medicine  consultation.   PLAN: -Blood work: B1, B6, MMA, copper -Referral to sleep medicine for insomnia  Since their last visit: ***  Lab work was normal.   Sleep medicine?***  MEDICATIONS:  Outpatient Encounter Medications as of 09/11/2022  Medication Sig   atorvastatin (LIPITOR) 40 MG tablet Take 1 tablet (40 mg total) by mouth daily.   VITAMIN D PO Take 2,000 Int'l Units by mouth daily at 2 am.   No facility-administered encounter medications on file as of 09/11/2022.    PAST MEDICAL HISTORY: Past Medical History:  Diagnosis Date   Abnormal Pap smear    BV (bacterial vaginosis)    h/o   Hypercholesteremia    PMB (postmenopausal bleeding)    Prediabetes     PAST SURGICAL HISTORY: No past surgical history on file.  ALLERGIES: No Known Allergies  FAMILY HISTORY: Family History  Problem Relation Age of Onset   Cancer Mother        uterine   Hypertension Maternal Uncle    Diabetes Maternal Uncle    Diabetes Maternal Aunt    Cancer Maternal Aunt        ovarian   Diabetes Maternal Grandmother    Cancer Father        lung    SOCIAL HISTORY: Social History   Tobacco Use   Smoking status: Never   Smokeless tobacco: Never  Vaping Use   Vaping Use: Never used  Substance Use Topics   Alcohol use: No   Drug  use: No   Social History   Social History Narrative   Lives with husband in a one story home   Caffeine - occas   Right handed      Objective:  Vital Signs:  There were no vitals taken for this visit.  ***  Labs and Imaging review: New results: 03/10/22: Copper wnl B6 wnl MMA wnl B1: 8 (lln)  Previously reviewed results: Lab Results  Component Value Date    HGBA1C 6.3 (H) 07/22/2021      Recent Labs       Lab Results  Component Value Date    VITAMINB12 324 09/05/2019      Recent Labs       Lab Results  Component Value Date    TSH 1.150 03/18/2021      Recent Labs[] Expand by Default  No results found for: "ESRSEDRATE",  "POCTSEDRATE"     Vit D (07/22/21): 48.2     External labs: Normal or unremarkable: TSH, CBC, CMP B12 (02/22/22): 568  Assessment/Plan:  [name], a 67 y.o. female with: ***   Plan: ***  Return to clinic in ***  Total time spent reviewing records, interview, history/exam, documentation, and coordination of care on day of encounter:  *** min  Jacquelyne Balint, MD

## 2022-09-01 DIAGNOSIS — H938X3 Other specified disorders of ear, bilateral: Secondary | ICD-10-CM | POA: Diagnosis not present

## 2022-09-01 DIAGNOSIS — J3489 Other specified disorders of nose and nasal sinuses: Secondary | ICD-10-CM | POA: Diagnosis not present

## 2022-09-01 DIAGNOSIS — R0981 Nasal congestion: Secondary | ICD-10-CM | POA: Diagnosis not present

## 2022-09-02 DIAGNOSIS — J3489 Other specified disorders of nose and nasal sinuses: Secondary | ICD-10-CM | POA: Diagnosis not present

## 2022-09-10 ENCOUNTER — Emergency Department (HOSPITAL_COMMUNITY)
Admission: EM | Admit: 2022-09-10 | Discharge: 2022-09-10 | Disposition: A | Discharge status: Home / Self Care | Payer: BC Managed Care – PPO | Attending: Emergency Medicine | Admitting: Emergency Medicine

## 2022-09-10 ENCOUNTER — Emergency Department (HOSPITAL_COMMUNITY): Payer: BC Managed Care – PPO

## 2022-09-10 DIAGNOSIS — N390 Urinary tract infection, site not specified: Secondary | ICD-10-CM | POA: Diagnosis not present

## 2022-09-10 DIAGNOSIS — R109 Unspecified abdominal pain: Secondary | ICD-10-CM | POA: Diagnosis not present

## 2022-09-10 DIAGNOSIS — N1330 Unspecified hydronephrosis: Secondary | ICD-10-CM | POA: Diagnosis not present

## 2022-09-10 DIAGNOSIS — R350 Frequency of micturition: Secondary | ICD-10-CM | POA: Diagnosis not present

## 2022-09-10 LAB — URINALYSIS, W/ REFLEX TO CULTURE (INFECTION SUSPECTED)
Bilirubin Urine: NEGATIVE
Glucose, UA: NEGATIVE mg/dL
Ketones, ur: NEGATIVE mg/dL
Nitrite: NEGATIVE
Protein, ur: 30 mg/dL — AB
RBC / HPF: >50 RBC/hpf (ref 0–5)
Specific Gravity, Urine: 1.016 (ref 1.005–1.030)
WBC, UA: >50 WBC/hpf (ref 0–5)
pH: 6 (ref 5.0–8.0)

## 2022-09-10 LAB — CBC WITH DIFFERENTIAL/PLATELET
Abs Immature Granulocytes: 0.02 10*3/uL (ref 0.00–0.07)
Basophils Absolute: 0 10*3/uL (ref 0.0–0.1)
Basophils Relative: 0 %
Eosinophils Absolute: 0 10*3/uL (ref 0.0–0.5)
Eosinophils Relative: 0 %
HCT: 42.8 % (ref 36.0–46.0)
Hemoglobin: 14.3 g/dL (ref 12.0–15.0)
Immature Granulocytes: 0 %
Lymphocytes Relative: 20 %
Lymphs Abs: 1.8 10*3/uL (ref 0.7–4.0)
MCH: 29.7 pg (ref 26.0–34.0)
MCHC: 33.4 g/dL (ref 30.0–36.0)
MCV: 88.8 fL (ref 80.0–100.0)
Monocytes Absolute: 0.6 10*3/uL (ref 0.1–1.0)
Monocytes Relative: 7 %
Neutro Abs: 6.5 10*3/uL (ref 1.7–7.7)
Neutrophils Relative %: 73 %
Platelets: 272 10*3/uL (ref 150–400)
RBC: 4.82 MIL/uL (ref 3.87–5.11)
RDW: 13 % (ref 11.5–15.5)
WBC: 9 10*3/uL (ref 4.0–10.5)
nRBC: 0 % (ref 0.0–0.2)

## 2022-09-10 LAB — COMPREHENSIVE METABOLIC PANEL
ALT: 31 U/L (ref 0–44)
AST: 41 U/L (ref 15–41)
Albumin: 4.1 g/dL (ref 3.5–5.0)
Alkaline Phosphatase: 76 U/L (ref 38–126)
Anion gap: 11 (ref 5–15)
BUN: 15 mg/dL (ref 8–23)
CO2: 26 mmol/L (ref 22–32)
Calcium: 9.9 mg/dL (ref 8.9–10.3)
Chloride: 103 mmol/L (ref 98–111)
Creatinine, Ser: 1.17 mg/dL — ABNORMAL HIGH (ref 0.44–1.00)
GFR, Estimated: 52 mL/min — ABNORMAL LOW (ref >60)
Glucose, Bld: 134 mg/dL — ABNORMAL HIGH (ref 70–99)
Potassium: 3.5 mmol/L (ref 3.5–5.1)
Sodium: 140 mmol/L (ref 135–145)
Total Bilirubin: 1.5 mg/dL — ABNORMAL HIGH (ref 0.3–1.2)
Total Protein: 7.6 g/dL (ref 6.5–8.1)

## 2022-09-10 LAB — LIPASE, BLOOD: Lipase: 37 U/L (ref 11–51)

## 2022-09-10 MED ORDER — KETOROLAC TROMETHAMINE 30 MG/ML IJ SOLN
15.0000 mg | Freq: Once | INTRAMUSCULAR | Status: DC
  Filled 2022-09-10: qty 1

## 2022-09-10 MED ORDER — HYDROCODONE-ACETAMINOPHEN 5-325 MG PO TABS
2.0000 | ORAL_TABLET | Freq: Once | ORAL | Status: AC
  Administered 2022-09-10: 2 via ORAL
  Filled 2022-09-10: qty 2

## 2022-09-10 MED ORDER — CEPHALEXIN 500 MG PO CAPS: 1000.0000 mg | ORAL_CAPSULE | Freq: Two times a day (BID) | ORAL | 0 refills | Status: DC

## 2022-09-10 MED ORDER — SODIUM CHLORIDE 0.9 % IV BOLUS: 1000.0000 mL | Freq: Once | INTRAVENOUS | Status: DC

## 2022-09-10 MED ORDER — ONDANSETRON 4 MG PO TBDP
8.0000 mg | ORAL_TABLET | Freq: Once | ORAL | Status: AC
  Administered 2022-09-10: 8 mg via ORAL
  Filled 2022-09-10: qty 2

## 2022-09-10 MED ORDER — CEPHALEXIN 250 MG PO CAPS
1000.0000 mg | ORAL_CAPSULE | Freq: Once | ORAL | Status: AC
  Administered 2022-09-10: 1000 mg via ORAL
  Filled 2022-09-10: qty 4

## 2022-09-10 MED ORDER — KETOROLAC TROMETHAMINE 30 MG/ML IJ SOLN
30.0000 mg | Freq: Once | INTRAMUSCULAR | Status: AC
  Administered 2022-09-10: 30 mg via INTRAMUSCULAR
  Filled 2022-09-10: qty 1

## 2022-09-10 MED ORDER — MORPHINE SULFATE (PF) 4 MG/ML IV SOLN
4.0000 mg | Freq: Once | INTRAVENOUS | Status: DC
  Filled 2022-09-10: qty 1

## 2022-09-10 MED ORDER — ONDANSETRON HCL 4 MG/2ML IJ SOLN
4.0000 mg | Freq: Once | INTRAMUSCULAR | Status: DC
  Filled 2022-09-10: qty 2

## 2022-09-10 NOTE — ED Provider Notes (Signed)
[facility] EMERGENCY DEPARTMENT AT [facility] Provider Note   CSN: [id number] Arrival date & time: 09/10/22  4098     History  No chief complaint on file.  HPI Carol Gray is a 67 y.o. female with history of prediabetes and hypercholesterolemia presenting for right flank pain.  Started at 9 PM last night. Gradual in onset. Also intermittent flank pain over the last few days. Located in the right flank and feels sharp and at times radiates to the right groin.  Endorses increased urinary frequency.  Denies painful urination, hematuria malodorous urine.  Denies fever and chills.  HPI     Home Medications Prior to Admission medications   Medication Sig Start Date End Date Taking? Authorizing Provider  cephALEXin (KEFLEX) 500 MG capsule Take 2 capsules (1,000 mg total) by mouth 2 (two) times daily. 09/10/22  Yes Gareth Eagle, PA-C  atorvastatin (LIPITOR) 40 MG tablet Take 1 tablet (40 mg total) by mouth daily. 01/29/22   Arnette Felts, FNP  VITAMIN D PO Take 2,000 Int'l Units by mouth daily at 2 am.    [provider]      Allergies    Patient has no known allergies.    Review of Systems   See HPI for pertinent positives   Physical Exam   Vitals:   09/10/22 0300 09/10/22 0315  BP: (!) 164/78 (!) 162/79  Pulse: (!) 57 (!) 56  Resp:    Temp:    SpO2: 99% 99%    CONSTITUTIONAL:  well-appearing, NAD NEURO:  Alert and oriented x 3, CN 3-12 grossly intact EYES:  eyes equal and reactive ENT/NECK:  Supple, no stridor  CARDIO:  regular rate and rhythm, appears well-perfused  PULM:  No respiratory distress, CTAB GI/GU:  non-distended, soft, right CVA tenderness MSK/SPINE:  No gross deformities, no edema, moves all extremities  SKIN:  no rash, atraumatic  *Additional and/or pertinent findings included in MDM below   ED Results / Procedures / Treatments   Labs (all labs ordered are listed, but only abnormal results are displayed) Labs Reviewed   COMPREHENSIVE METABOLIC PANEL - Abnormal; Notable for the following components:      Result Value   Glucose, Bld 134 (*)    Creatinine, Ser 1.17 (*)    Total Bilirubin 1.5 (*)    GFR, Estimated 52 (*)    All other components within normal limits  URINALYSIS, W/ REFLEX TO CULTURE (INFECTION SUSPECTED) - Abnormal; Notable for the following components:   APPearance HAZY (*)    Hgb urine dipstick MODERATE (*)    Protein, ur 30 (*)    Leukocytes,Ua LARGE (*)    Bacteria, UA FEW (*)    All other components within normal limits  URINE CULTURE  CBC WITH DIFFERENTIAL/PLATELET  LIPASE, BLOOD    EKG None  Radiology CT Renal Stone Study  Result Date: 09/10/2022 CLINICAL DATA:  Right flank pain EXAM: CT ABDOMEN AND PELVIS WITHOUT CONTRAST TECHNIQUE: Multidetector CT imaging of the abdomen and pelvis was performed following the standard protocol without IV contrast. RADIATION DOSE REDUCTION: This exam was performed according to the departmental dose-optimization program which includes automated exposure control, adjustment of the mA and/or kV according to patient size and/or use of iterative reconstruction technique. COMPARISON:  None Available. FINDINGS: Lower chest: No acute abnormality Hepatobiliary: Gallstones layering within the gallbladder. No focal hepatic abnormality or biliary ductal dilatation. Pancreas: No focal abnormality or ductal dilatation. Spleen: No focal abnormality.  Normal size. Adrenals/Urinary  Tract: Adrenal glands normal. Right hydronephrosis and perinephric stranding. No definite ureteral stones. Multiple bladder stones layering dependently in the bladder, 1 may have recently passed. Largest bladder stone measures 13 mm. No stones or hydronephrosis on the left. Stomach/Bowel: Normal appendix. Stomach, large and small bowel grossly unremarkable. Vascular/Lymphatic: Aortic atherosclerosis. No evidence of aneurysm or adenopathy. Reproductive: Uterus and adnexa unremarkable.  No  mass. Other: No free fluid or free air. Musculoskeletal: No acute bony abnormality. IMPRESSION: Moderate right hydronephrosis and perinephric stranding. No current ureteral stones on the right, but there are several layering bladder stones, 1 of which may have recently passed. Largest bladder stone is 13 mm. Aortic atherosclerosis. Cholelithiasis. Electronically Signed   By: Charlett Nose M.D.   On: 09/10/2022 01:19    Procedures Procedures    Medications Ordered in ED Medications  ketorolac (TORADOL) 30 MG/ML injection 30 mg (has no administration in time range)  ondansetron (ZOFRAN-ODT) disintegrating tablet 8 mg (8 mg Oral Given 09/10/22 0245)  HYDROcodone-acetaminophen (NORCO/VICODIN) 5-325 MG per tablet 2 tablet (2 tablets Oral Given 09/10/22 0245)  cephALEXin (KEFLEX) capsule 1,000 mg (1,000 mg Oral Given 09/10/22 0247)    ED Course/ Medical Decision Making/ A&P                             Medical Decision Making  67 year old well-appearing female presenting for right flank pain.  Exam notable for right CVA tenderness.  DDx includes nephrolithiasis, AKI, kidney infection, acute cholecystitis, rib fracture or pneumothorax.  Overall patient appears clinically well.  Personally reviewed and interpreted CT scan which revealed moderate right-sided hydronephrosis with multiple bladder stones.  Labs revealed hematuria, pyuria, elevated creatinine, elevated bili.  Clinical findings, symptoms and CT scan findings support likely passed kidney stone.  Considered associated kidney infection but unlikely given the patient is afebrile, no leukocytosis and denied vomiting and diarrhea.  Urinalysis did shows signs of concern for possible UTI.  Treated her pain with Norco and Toradol.  Treated for UTI with Keflex.  Upon reevaluation patient stated that her pain overall had improved.  Advised her to continue on the Keflex for UTI and follow-up with her PCP.  Discussed return precautions.  Vital stable at  discharge.        Final Clinical Impression(s) / ED Diagnoses Final diagnoses:  Urinary tract infection with hematuria, site unspecified    Rx / DC Orders ED Discharge Orders          Ordered    cephALEXin (KEFLEX) 500 MG capsule  2 times daily        09/10/22 0246              Gareth Eagle, PA-C 09/10/22 0330    Melene Plan, DO 09/10/22 [phone]

## 2022-09-10 NOTE — Discharge Instructions (Signed)
Evaluation for your right flank pain today revealed that you likely have passed a kidney stone.  Suspect your symptoms will improve within the next 24 hours.  You can take Tylenol ibuprofen for your symptoms.  Also more importantly focus on rehydration.  Urinalysis also revealed concern for UTI.  I have started you on Keflex which is an antibiotic that will treat your UTI.  Recommend that you follow-up with your PCP in 3 to 4 days.  If you develop fever, nausea and vomiting diarrhea, worsening flank or abdominal pain or any other concerning symptom please return emerged part for further evaluation.

## 2022-09-10 NOTE — ED Triage Notes (Signed)
PT having R sided flank pain starting 9pm. Treated for overactive bladder. Restless with pain when urinating. Denies vomiting but endorses for nausea.

## 2022-09-10 NOTE — ED Notes (Signed)
Pt ambulatory to room with steady gait c/o right flank pain that began at 9pm tonight. Pt dx with bladder issues earlier this week. Pt denies n/v/d and no blood in urine noted. Respirations even and non labored vs wnl

## 2022-09-10 NOTE — ED Provider Triage Note (Signed)
Emergency Medicine Provider Triage Evaluation Note  Carol Gray , a 67 y.o. female  was evaluated in triage.  Pt complains of right-sided flank pain.  Patient states that symptoms began around 9 PM insidiously.  States she had some vague symptoms of right-sided flank pain intermittently over the past few days but seem to last momentarily before resolution.  Reports right flank pain with radiation down the right inguinal region.  States she has worsening of pain with trying to urinate.  Denies fever, vomiting, dysuria, hematuria, vaginal symptoms, change in bowel habits.  Has reported some nausea.  Denies history of kidney stones  Review of Systems  Positive: See above Negative:   Physical Exam  BP (!) 173/99 (BP Location: Right Arm)   Pulse 85   Temp 98.1 F (36.7 C)   Resp 16   SpO2 97%  Gen:   Awake, no distress   Resp:  Normal effort  MSK:   Moves extremities without difficulty  Other:    Medical Decision Making  Medically screening exam initiated at 1:00 AM.  Appropriate orders placed.  Carol Gray was informed that the remainder of the evaluation will be completed by another provider, this initial triage assessment does not replace that evaluation, and the importance of remaining in the ED until their evaluation is complete.     Carol Gray, Carol Gray 09/10/22 [phone]

## 2022-09-10 NOTE — ED Notes (Signed)
Pt stuck multiple times by 2 nurses unable to gain IV access pt requesting PO medications MD advised

## 2022-09-11 ENCOUNTER — Ambulatory Visit: Payer: BC Managed Care – PPO | Admitting: Neurology

## 2022-09-11 LAB — URINE CULTURE: Culture: <10000 — AB

## 2022-09-17 DIAGNOSIS — K5900 Constipation, unspecified: Secondary | ICD-10-CM | POA: Diagnosis not present

## 2022-09-21 ENCOUNTER — Other Ambulatory Visit: Payer: Self-pay

## 2022-09-21 ENCOUNTER — Telehealth: Payer: Self-pay

## 2022-09-21 DIAGNOSIS — E782 Mixed hyperlipidemia: Secondary | ICD-10-CM

## 2022-09-21 NOTE — Telephone Encounter (Signed)
Patient called today requesting a refill on atorvastatin, patient hasn't been seen since 07/22/2021. Patient is scheduled for 10/07/2022. Patient has cancelled multiple appts, I called trying to reschedule patient to this week so she could get refill before running out. Patient declined appt stating "I will just be out of it I guess ill be fine".

## 2022-10-02 DIAGNOSIS — H2513 Age-related nuclear cataract, bilateral: Secondary | ICD-10-CM | POA: Diagnosis not present

## 2022-10-07 ENCOUNTER — Encounter: Payer: Self-pay | Admitting: Family Medicine

## 2022-10-07 ENCOUNTER — Ambulatory Visit (INDEPENDENT_AMBULATORY_CARE_PROVIDER_SITE_OTHER): Payer: BC Managed Care – PPO | Admitting: Family Medicine

## 2022-10-07 ENCOUNTER — Other Ambulatory Visit: Payer: Self-pay

## 2022-10-07 VITALS — BP 112/60 | HR 62 | Temp 98.4°F | Ht 70.0 in | Wt 241.8 lb

## 2022-10-07 DIAGNOSIS — Z23 Encounter for immunization: Secondary | ICD-10-CM

## 2022-10-07 DIAGNOSIS — R7303 Prediabetes: Secondary | ICD-10-CM | POA: Diagnosis not present

## 2022-10-07 DIAGNOSIS — Z0000 Encounter for general adult medical examination without abnormal findings: Secondary | ICD-10-CM

## 2022-10-07 DIAGNOSIS — E782 Mixed hyperlipidemia: Secondary | ICD-10-CM

## 2022-10-07 DIAGNOSIS — E669 Obesity, unspecified: Secondary | ICD-10-CM | POA: Diagnosis not present

## 2022-10-07 DIAGNOSIS — Z6834 Body mass index (BMI) 34.0-34.9, adult: Secondary | ICD-10-CM

## 2022-10-07 MED ORDER — ATORVASTATIN CALCIUM 40 MG PO TABS
40.0000 mg | ORAL_TABLET | Freq: Every day | ORAL | 1 refills | Status: DC
Start: 2022-10-07 — End: 2023-04-09

## 2022-10-07 NOTE — Progress Notes (Signed)
Carol Gray,acting as a Neurosurgeon for [employer], NP.,have documented all relevant documentation on the behalf of Carol Busser, NP,as directed by  Carol Rhett Moshe Salisbury, NP while in the presence of Carol Stefanski, NP.   Subjective:     Patient ID: Carol Gray , female    DOB: 1955-08-13 , 67 y.o.   MRN: [id number]   Chief Complaint  Patient presents with   Annual Exam    HPI  Patient here for her yearly wellness visit.Had mammogram at [facility] in 07/2022, last pap smear was in February 2023 with Carol Gray at [facility], she states her goal now is 30 minutes -1 hour of aerobics exercise 2-3 times a week, because diabetes runs in her family and her last hemoglobin A1c has been gradually increased upto 6.3  Patient states she was okay for today , had no concerns or questions.      Past Medical History:  Diagnosis Date   Abnormal Pap smear    BV (bacterial vaginosis)    h/o   Hypercholesteremia    PMB (postmenopausal bleeding)    Prediabetes      Family History  Problem Relation Age of Onset   Cancer Mother        uterine   Hypertension Maternal Uncle    Diabetes Maternal Uncle    Diabetes Maternal Aunt    Cancer Maternal Aunt        ovarian   Diabetes Maternal Grandmother    Cancer Father        lung     Current Outpatient Medications:    VITAMIN D PO, Take 2,000 Int'l Units by mouth daily at 2 am., Disp: , Rfl:    atorvastatin (LIPITOR) 40 MG tablet, Take 1 tablet (40 mg total) by mouth daily., Disp: 90 tablet, Rfl: 1   No Known Allergies       Social History   Tobacco Use  Smoking Status Never  Smokeless Tobacco Never  . Social History   Substance and Sexual Activity  Alcohol Use No  . Review of Systems  Constitutional: Negative.   HENT: Negative.    Eyes: Negative.   Respiratory: Negative.    Cardiovascular: Negative.   Gastrointestinal: Negative.   Endocrine: Negative.   Genitourinary: Negative.   Musculoskeletal: Negative.    Skin: Negative.   Allergic/Immunologic: Negative.   Neurological: Negative.   Hematological: Negative.   Psychiatric/Behavioral: Negative.       Today's Vitals   10/07/22 0829  BP: 112/60  Pulse: 62  Temp: 98.4 F (36.9 C)  Weight: 241 lb 12.8 oz (109.7 kg)  Height: 5\' 10"  (1.778 m)  PainSc: 0-No pain   Body mass index is 34.69 kg/m.  Wt Readings from Last 3 Encounters:  10/07/22 241 lb 12.8 oz (109.7 kg)  03/10/22 243 lb (110.2 kg)  07/22/21 248 lb 11.2 oz (112.8 kg)     Objective:  Physical Exam Constitutional:      Appearance: Normal appearance.  Cardiovascular:     Rate and Rhythm: Normal rate and regular rhythm.     Pulses: Normal pulses.     Heart sounds: Normal heart sounds.  Pulmonary:     Effort: Pulmonary effort is normal.     Breath sounds: Normal breath sounds.  Abdominal:     General: Bowel sounds are normal.  Musculoskeletal:        General: Normal range of motion.  Skin:    General: Skin is warm and dry.  Neurological:  General: No focal deficit present.     Mental Status: She is alert and oriented to person, place, and time. Mental status is at baseline.  Psychiatric:        Mood and Affect: Mood normal.         Assessment And Plan:     1. Encounter for general adult medical examination w/o abnormal findings  2. Prediabetes - Hemoglobin A1c - CBC no Diff - CMP14+EGFR  3. Mixed hyperlipidemia - Lipid panel  4. Immunization due - Tdap vaccine greater than or equal to 7yo IM  5. Obesity (BMI 30.0-34.9) She is encouraged to strive for BMI less than 30 to decrease cardiac risk. Advised to aim for at least 150 minutes of exercise per week.    Return in 6 months (on 04/09/2023) for prediabetes. Patient was given opportunity to ask questions. Patient verbalized understanding of the plan and was able to repeat key elements of the plan. All questions were answered to their satisfaction.   Carol Berrie Moshe Salisbury, NP   I, Carol Asche Moshe Salisbury, NP, have  reviewed all documentation for this visit. The documentation on 10/07/22 for the exam, diagnosis, procedures, and orders are all accurate and complete.   THE PATIENT IS ENCOURAGED TO PRACTICE SOCIAL DISTANCING DUE TO THE COVID-19 PANDEMIC.

## 2022-10-07 NOTE — Patient Instructions (Signed)

## 2022-10-08 LAB — LIPID PANEL
Chol/HDL Ratio: 4.4 ratio (ref 0.0–4.4)
Cholesterol, Total: 204 mg/dL — ABNORMAL HIGH (ref 100–199)
HDL: 46 mg/dL (ref >39)
LDL Chol Calc (NIH): 140 mg/dL — ABNORMAL HIGH (ref 0–99)
Triglycerides: 102 mg/dL (ref 0–149)
VLDL Cholesterol Cal: 18 mg/dL (ref 5–40)

## 2022-10-08 LAB — HEMOGLOBIN A1C
Est. average glucose Bld gHb Est-mCnc: 134 mg/dL
Hgb A1c MFr Bld: 6.3 % — ABNORMAL HIGH (ref 4.8–5.6)

## 2022-10-08 LAB — CMP14+EGFR
ALT: 28 IU/L (ref 0–32)
AST: 31 IU/L (ref 0–40)
Albumin/Globulin Ratio: 1.6 (ref 1.2–2.2)
Albumin: 4.3 g/dL (ref 3.9–4.9)
Alkaline Phosphatase: 88 IU/L (ref 44–121)
BUN/Creatinine Ratio: 10 — ABNORMAL LOW (ref 12–28)
BUN: 9 mg/dL (ref 8–27)
Bilirubin Total: 1.1 mg/dL (ref 0.0–1.2)
CO2: 19 mmol/L — ABNORMAL LOW (ref 20–29)
Calcium: 9.5 mg/dL (ref 8.7–10.3)
Chloride: 107 mmol/L — ABNORMAL HIGH (ref 96–106)
Creatinine, Ser: 0.94 mg/dL (ref 0.57–1.00)
Globulin, Total: 2.7 g/dL (ref 1.5–4.5)
Glucose: 100 mg/dL — ABNORMAL HIGH (ref 70–99)
Potassium: 4.1 mmol/L (ref 3.5–5.2)
Sodium: 143 mmol/L (ref 134–144)
Total Protein: 7 g/dL (ref 6.0–8.5)
eGFR: 68 mL/min/{1.73_m2} (ref >59)

## 2022-10-08 LAB — CBC
Hematocrit: 39.5 % (ref 34.0–46.6)
Hemoglobin: 12.9 g/dL (ref 11.1–15.9)
MCH: 29.1 pg (ref 26.6–33.0)
MCHC: 32.7 g/dL (ref 31.5–35.7)
MCV: 89 fL (ref 79–97)
Platelets: 233 10*3/uL (ref 150–450)
RBC: 4.44 x10E6/uL (ref 3.77–5.28)
RDW: 13.3 % (ref 11.7–15.4)
WBC: 3.6 10*3/uL (ref 3.4–10.8)

## 2022-10-15 NOTE — Progress Notes (Signed)
Elevated cholesterol levels; atorvastatin has been sent to be pharmacy.  A1c is 6.3;Prediabetes, no changes from last year

## 2022-10-22 ENCOUNTER — Telehealth: Payer: Self-pay

## 2022-10-22 NOTE — Telephone Encounter (Signed)
Patient called requesting a referral to [facility] provider. I called back pt to see what the referral was for because there is nothing in the chart about this matter. She stated she did not speak with the provider about it because she did not want to pay a copay for an OV because she was there for a physical. I offered the pt an appointment to address her concerns so the referral could be sent and she declined. YL,RMA

## 2022-10-30 ENCOUNTER — Ambulatory Visit (HOSPITAL_COMMUNITY)
Admission: EM | Admit: 2022-10-30 | Discharge: 2022-10-30 | Disposition: A | Discharge status: Home / Self Care | Payer: BC Managed Care – PPO | Attending: Physician Assistant | Admitting: Physician Assistant

## 2022-10-30 ENCOUNTER — Ambulatory Visit (INDEPENDENT_AMBULATORY_CARE_PROVIDER_SITE_OTHER): Payer: BC Managed Care – PPO

## 2022-10-30 ENCOUNTER — Encounter (HOSPITAL_COMMUNITY): Payer: Self-pay | Admitting: Emergency Medicine

## 2022-10-30 DIAGNOSIS — N200 Calculus of kidney: Secondary | ICD-10-CM | POA: Insufficient documentation

## 2022-10-30 DIAGNOSIS — R109 Unspecified abdominal pain: Secondary | ICD-10-CM | POA: Diagnosis not present

## 2022-10-30 LAB — COMPREHENSIVE METABOLIC PANEL
ALT: 38 U/L (ref 0–44)
AST: 42 U/L — ABNORMAL HIGH (ref 15–41)
Albumin: 4.2 g/dL (ref 3.5–5.0)
Alkaline Phosphatase: 81 U/L (ref 38–126)
Anion gap: 12 (ref 5–15)
BUN: 12 mg/dL (ref 8–23)
CO2: 25 mmol/L (ref 22–32)
Calcium: 10.1 mg/dL (ref 8.9–10.3)
Chloride: 103 mmol/L (ref 98–111)
Creatinine, Ser: 1.4 mg/dL — ABNORMAL HIGH (ref 0.44–1.00)
GFR, Estimated: 42 mL/min — ABNORMAL LOW (ref >60)
Glucose, Bld: 97 mg/dL (ref 70–99)
Potassium: 3.8 mmol/L (ref 3.5–5.1)
Sodium: 140 mmol/L (ref 135–145)
Total Bilirubin: 1.5 mg/dL — ABNORMAL HIGH (ref 0.3–1.2)
Total Protein: 7.7 g/dL (ref 6.5–8.1)

## 2022-10-30 LAB — CBC WITH DIFFERENTIAL/PLATELET
Abs Immature Granulocytes: 0.02 10*3/uL (ref 0.00–0.07)
Basophils Absolute: 0 10*3/uL (ref 0.0–0.1)
Basophils Relative: 0 %
Eosinophils Absolute: 0 10*3/uL (ref 0.0–0.5)
Eosinophils Relative: 0 %
HCT: 42.5 % (ref 36.0–46.0)
Hemoglobin: 14 g/dL (ref 12.0–15.0)
Immature Granulocytes: 0 %
Lymphocytes Relative: 15 %
Lymphs Abs: 1.3 10*3/uL (ref 0.7–4.0)
MCH: 29.7 pg (ref 26.0–34.0)
MCHC: 32.9 g/dL (ref 30.0–36.0)
MCV: 90 fL (ref 80.0–100.0)
Monocytes Absolute: 0.7 10*3/uL (ref 0.1–1.0)
Monocytes Relative: 8 %
Neutro Abs: 6.7 10*3/uL (ref 1.7–7.7)
Neutrophils Relative %: 77 %
Platelets: 244 10*3/uL (ref 150–400)
RBC: 4.72 MIL/uL (ref 3.87–5.11)
RDW: 12.9 % (ref 11.5–15.5)
WBC: 8.7 10*3/uL (ref 4.0–10.5)
nRBC: 0 % (ref 0.0–0.2)

## 2022-10-30 LAB — POCT URINALYSIS DIP (MANUAL ENTRY)
Bilirubin, UA: NEGATIVE
Glucose, UA: NEGATIVE mg/dL
Ketones, POC UA: NEGATIVE mg/dL
Leukocytes, UA: NEGATIVE
Nitrite, UA: NEGATIVE
Protein Ur, POC: NEGATIVE mg/dL
Spec Grav, UA: 1.015 (ref 1.010–1.025)
Urobilinogen, UA: 0.2 E.U./dL
pH, UA: 7 (ref 5.0–8.0)

## 2022-10-30 MED ORDER — ONDANSETRON 4 MG PO TBDP
ORAL_TABLET | ORAL | Status: AC
  Filled 2022-10-30: qty 1

## 2022-10-30 MED ORDER — KETOROLAC TROMETHAMINE 30 MG/ML IJ SOLN
INTRAMUSCULAR | Status: AC
  Filled 2022-10-30: qty 1

## 2022-10-30 MED ORDER — TAMSULOSIN HCL 0.4 MG PO CAPS: 0.4000 mg | ORAL_CAPSULE | Freq: Every day | ORAL | 0 refills | Status: DC

## 2022-10-30 MED ORDER — KETOROLAC TROMETHAMINE 30 MG/ML IJ SOLN
30.0000 mg | Freq: Once | INTRAMUSCULAR | Status: AC
  Administered 2022-10-30: 30 mg via INTRAMUSCULAR

## 2022-10-30 MED ORDER — ONDANSETRON 4 MG PO TBDP
4.0000 mg | ORAL_TABLET | Freq: Once | ORAL | Status: AC
  Administered 2022-10-30: 4 mg via ORAL

## 2022-10-30 NOTE — ED Triage Notes (Signed)
Pt reports that Wed had right flank pain, urinary frequency, bloating, constipation and dysuria. Took Tylenol and Ibuprofen. Reports 13mm kidney stone back in April.  Then last night started having symptoms again.  Last good BM 3 days ago.

## 2022-10-30 NOTE — ED Provider Notes (Signed)
[facility]    CSN: [id number] Arrival date & time: 10/30/22  1321      History   Chief Complaint Chief Complaint  Patient presents with   Urinary Frequency   Flank Pain   Constipation    HPI Carol Gray is a 67 y.o. female.   Patient presents today with a 2-day history of right flank pain.  She reports that pain is constant, rated 8, described as cramping/aching, localized to her right flank with radiation into the abdomen, no alleviating factors identified.  She has had similar symptoms in April 2024 and was diagnosed with nephrolithiasis and UTI.  CT abdomen pelvis without contrast obtained during this visit showed multiple stones in her bladder with the largest measuring 13 mm.  She has not seen a urologist.  Denies any recent urogenital procedure, self-catheterization, single kidney.  She was prescribed antibiotics at that visit (cephalexin) but has not had additional antibiotics in the past 90 days.  She does report that she has become more constipated.  She has had constipation in the past but generally has been going to the bathroom 1-2 times per day without the use of medication.  Her last bowel movement was approximately 3 days ago.  She is passing gas normally.  She reports nausea but denies any vomiting.  Denies history of diabetes or immunosuppression.  Does not take SGLT2 inhibitor.    Past Medical History:  Diagnosis Date   Abnormal Pap smear    BV (bacterial vaginosis)    h/o   Hypercholesteremia    PMB (postmenopausal bleeding)    Prediabetes     Patient Active Problem List   Diagnosis Date Noted   Menorrhagia 12/17/2011    History reviewed. No pertinent surgical history.  OB History     Gravida  2   Para  2   Term  2   Preterm  0   AB  0   Living  2      SAB  0   IAB  0   Ectopic  0   Multiple  0   Live Births               Home Medications    Prior to Admission medications   Medication Sig Start Date End  Date Taking? Authorizing Provider  tamsulosin (FLOMAX) 0.4 MG CAPS capsule Take 1 capsule (0.4 mg total) by mouth daily. 10/30/22  Yes Cem Kosman K, PA-C  atorvastatin (LIPITOR) 40 MG tablet Take 1 tablet (40 mg total) by mouth daily. 10/07/22   [name], NP  VITAMIN D PO Take 2,000 Int'l Units by mouth daily at 2 am.    [provider]    Family History Family History  Problem Relation Age of Onset   Cancer Mother        uterine   Hypertension Maternal Uncle    Diabetes Maternal Uncle    Diabetes Maternal Aunt    Cancer Maternal Aunt        ovarian   Diabetes Maternal Grandmother    Cancer Father        lung    Social History Social History   Tobacco Use   Smoking status: Never   Smokeless tobacco: Never  Vaping Use   Vaping Use: Never used  Substance Use Topics   Alcohol use: No   Drug use: No     Allergies   Patient has no known allergies.   Review of Systems Review of  Systems  Constitutional:  Positive for activity change. Negative for appetite change, fatigue and fever.  Respiratory:  Negative for cough and shortness of breath.   Cardiovascular:  Negative for chest pain.  Gastrointestinal:  Positive for abdominal pain, constipation and nausea. Negative for diarrhea and vomiting.  Genitourinary:  Positive for dysuria, flank pain, frequency and urgency. Negative for hematuria, vaginal bleeding, vaginal discharge and vaginal pain.     Physical Exam Triage Vital Signs ED Triage Vitals  Enc Vitals Group     BP 10/30/22 1347 (!) 162/88     Pulse Rate 10/30/22 1347 67     Resp 10/30/22 1347 17     Temp 10/30/22 1347 98.7 F (37.1 C)     Temp Source 10/30/22 1347 Oral     SpO2 10/30/22 1347 98 %     Weight --      Height --      Head Circumference --      Peak Flow --      Pain Score 10/30/22 1348 8     Pain Loc --      Pain Edu? --      Excl. in GC? --    No data found.  Updated Vital Signs BP (!) 162/88 (BP Location: Right Arm)    Pulse 67   Temp 98.7 F (37.1 C) (Oral)   Resp 17   SpO2 98%   Visual Acuity Right Eye Distance:   Left Eye Distance:   Bilateral Distance:    Right Eye Near:   Left Eye Near:    Bilateral Near:     Physical Exam Vitals reviewed.  Constitutional:      General: She is awake. She is not in acute distress.    Appearance: Normal appearance. She is well-developed. She is not ill-appearing.     Comments: Very pleasant female appears stated age in no acute distress sitting comfortably in exam room  HENT:     Head: Normocephalic and atraumatic.     Mouth/Throat:     Pharynx: Uvula midline. No oropharyngeal exudate or posterior oropharyngeal erythema.  Cardiovascular:     Rate and Rhythm: Normal rate and regular rhythm.     Heart sounds: Normal heart sounds, S1 normal and S2 normal. No murmur heard. Pulmonary:     Effort: Pulmonary effort is normal.     Breath sounds: Normal breath sounds. No wheezing, rhonchi or rales.     Comments: Clear auscultation bilaterally Abdominal:     General: Bowel sounds are normal.     Palpations: Abdomen is soft.     Tenderness: There is no abdominal tenderness. There is no right CVA tenderness, left CVA tenderness, guarding or rebound.     Comments: Benign abdominal exam.  No tenderness palpation.  Psychiatric:        Behavior: Behavior is cooperative.      UC Treatments / Results  Labs (all labs ordered are listed, but only abnormal results are displayed) Labs Reviewed  POCT URINALYSIS DIP (MANUAL ENTRY) - Abnormal; Notable for the following components:      Result Value   Blood, UA small (*)    All other components within normal limits  URINE CULTURE  COMPREHENSIVE METABOLIC PANEL  CBC WITH DIFFERENTIAL/PLATELET    EKG   Radiology DG Abdomen 1 View  Result Date: 10/30/2022 CLINICAL DATA:  Right flank pain EXAM: ABDOMEN - 1 VIEW COMPARISON:  CT done on 09/10/2022 FINDINGS: Bowel gas pattern is nonspecific. There is 4 mm  calcific  density overlying the tip of right transverse process of L3 vertebra. There are few calcifications in the urinary bladder measuring up to 1.7 cm in diameter. IMPRESSION: There is 4 mm faint calcific density in the course of the right ureter at the L3 level. If there is clinical suspicion for ureteric colic, follow-up CT may be considered. There are few bladder calculi measuring up to 1.7 cm in diameter. Electronically Signed   By: Ernie Avena M.D.   On: 10/30/2022 14:57    Procedures Procedures (including critical care time)  Medications Ordered in UC Medications  ketorolac (TORADOL) 30 MG/ML injection 30 mg (30 mg Intramuscular Given 10/30/22 1458)  ondansetron (ZOFRAN-ODT) disintegrating tablet 4 mg (4 mg Oral Given 10/30/22 1458)    Initial Impression / Assessment and Plan / UC Course  I have reviewed the triage vital signs and the nursing notes.  Pertinent labs & imaging results that were available during my care of the patient were reviewed by me and considered in my medical decision making (see chart for details).     Patient is mildly hypertensive but otherwise well-appearing, afebrile, nontoxic, nontachycardic.  KUB was obtained that did show likely right ureteral stone.  She was given Zofran and Toradol in clinic with improvement of symptoms.  She was started on Flomax.  She was given urine strainer to try to catch kidney stone.  I did recommend that she follow-up with urology soon as possible and was given contact information for local provider with instruction to call to schedule an appointment.  She is to push fluids.  CBC and CMP were obtained if she has any abnormal kidney function or leukocytosis she will need to be seen immediately.  UA showed hematuria but no other evidence of infection.  Given her symptoms though we will send this for culture but defer antibiotics.  I did encourage her to use over-the-counter fiber supplements to encourage regular bowel movement.   Recommended close follow-up with her primary care.  Discussed that if she has any worsening or changing symptoms including increasing pain, nausea, vomiting, going any further without a bowel movement, not passing gas, fever she needs to be seen immediately.  Strict return precautions given to which she expressed understanding.  Final Clinical Impressions(s) / UC Diagnoses   Final diagnoses:  Nephrolithiasis  Right flank pain     Discharge Instructions      I believe your symptoms are related to a kidney stone.  Start Flomax daily.  I will contact you if your blood work is abnormal.  Please call and schedule an appointment with urology as soon as possible.  If your symptoms are not improving within a few days or if anything worsens and you have fever, worsening abdominal pain, nausea, vomiting, blood in your urine you need to go to the emergency room for a CT scan as we discussed.     ED Prescriptions     Medication Sig Dispense Auth. Provider   tamsulosin (FLOMAX) 0.4 MG CAPS capsule Take 1 capsule (0.4 mg total) by mouth daily. 14 capsule Laramie Gelles K, PA-C      PDMP not reviewed this encounter.   Jeani Hawking, PA-C 10/30/22 1523

## 2022-10-30 NOTE — Discharge Instructions (Signed)
I believe your symptoms are related to a kidney stone.  Start Flomax daily.  I will contact you if your blood work is abnormal.  Please call and schedule an appointment with urology as soon as possible.  If your symptoms are not improving within a few days or if anything worsens and you have fever, worsening abdominal pain, nausea, vomiting, blood in your urine you need to go to the emergency room for a CT scan as we discussed.

## 2022-11-01 LAB — URINE CULTURE: Culture: 10000 — AB

## 2022-11-02 IMAGING — CR DG CHEST 2V
2 series · 2 of 2 positions shown · non-contrast
Comparison: Chest x-ray 08/24/2016.

CLINICAL DATA: Left-sided chest pain.

EXAM:
CHEST - 2 VIEW

[w chest pa]
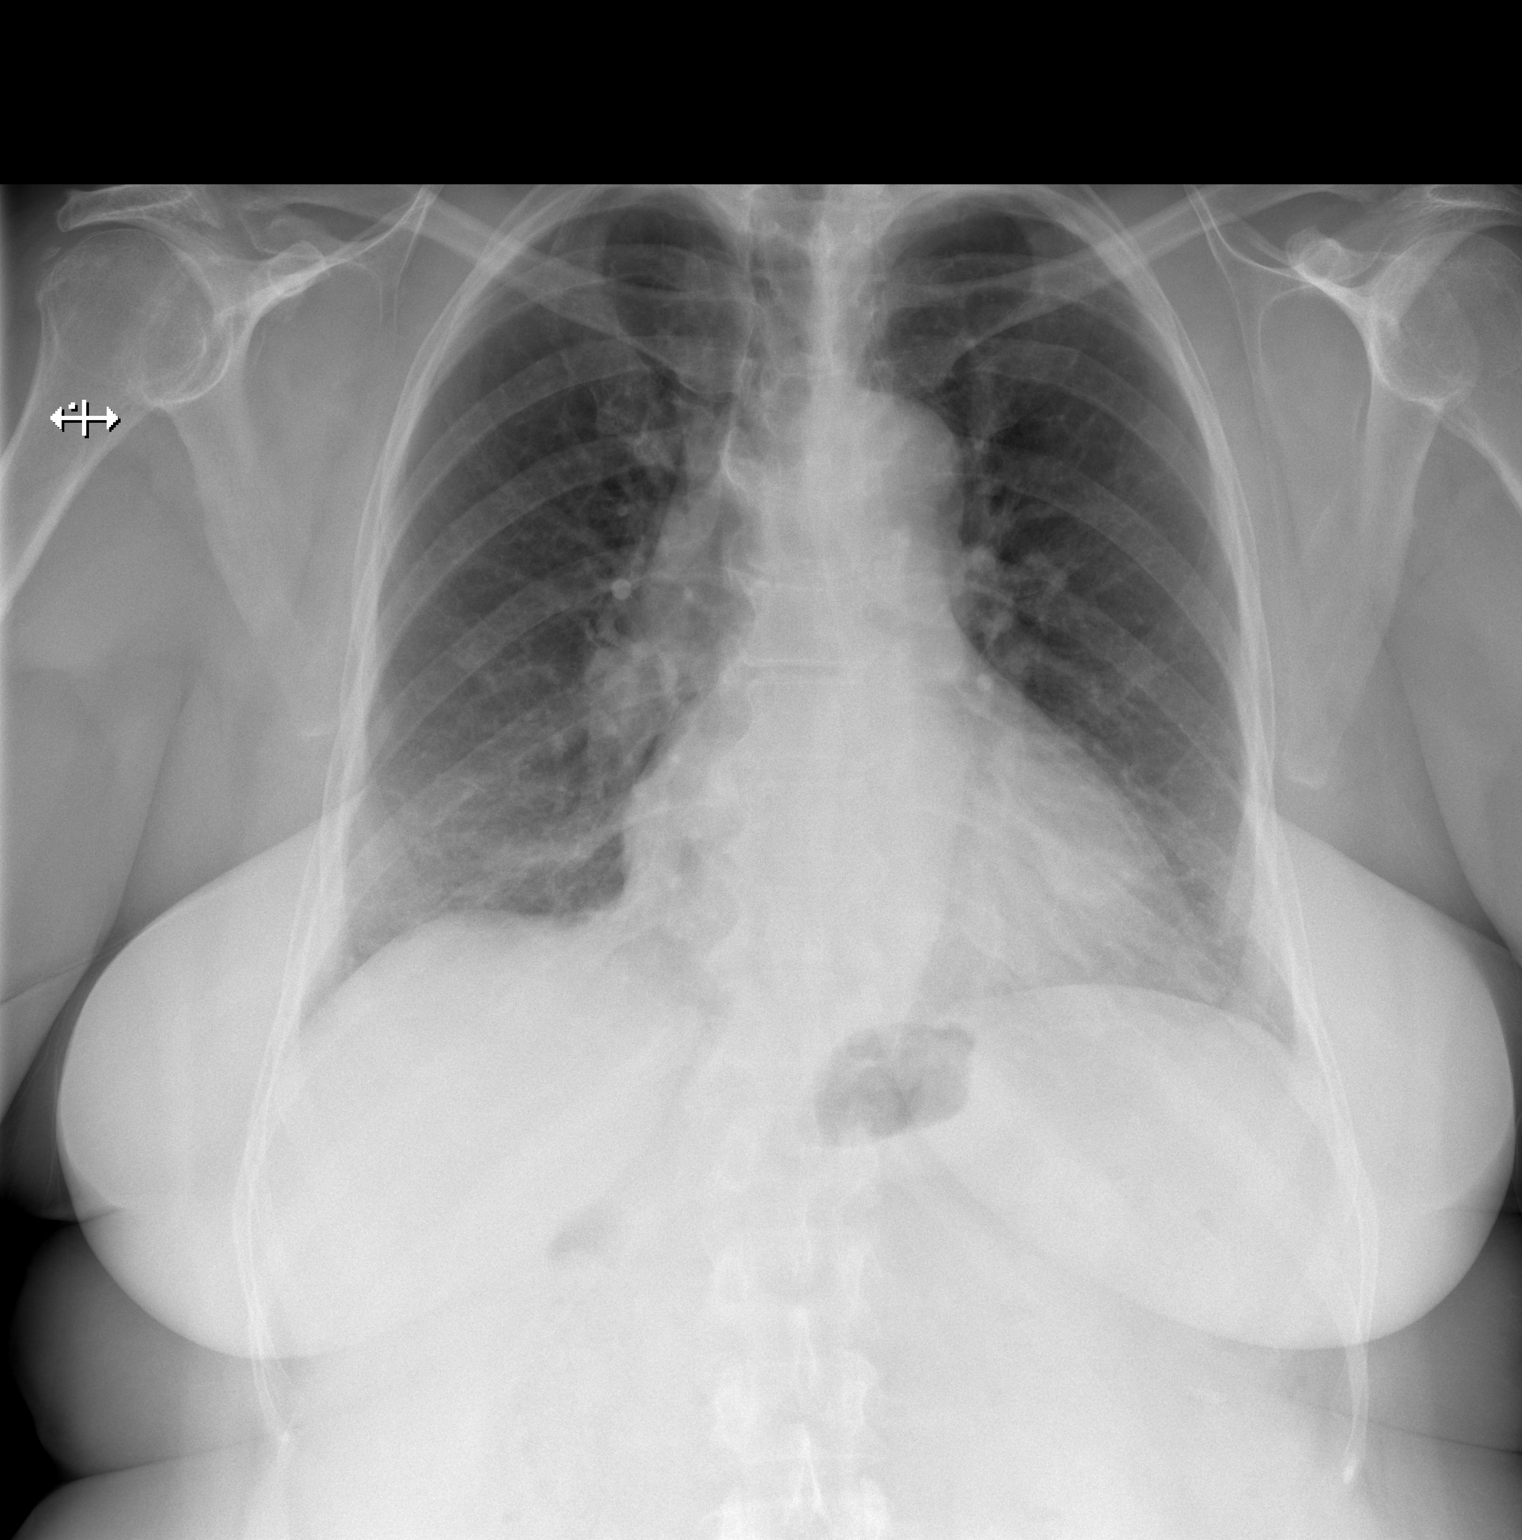

[w chest lat]
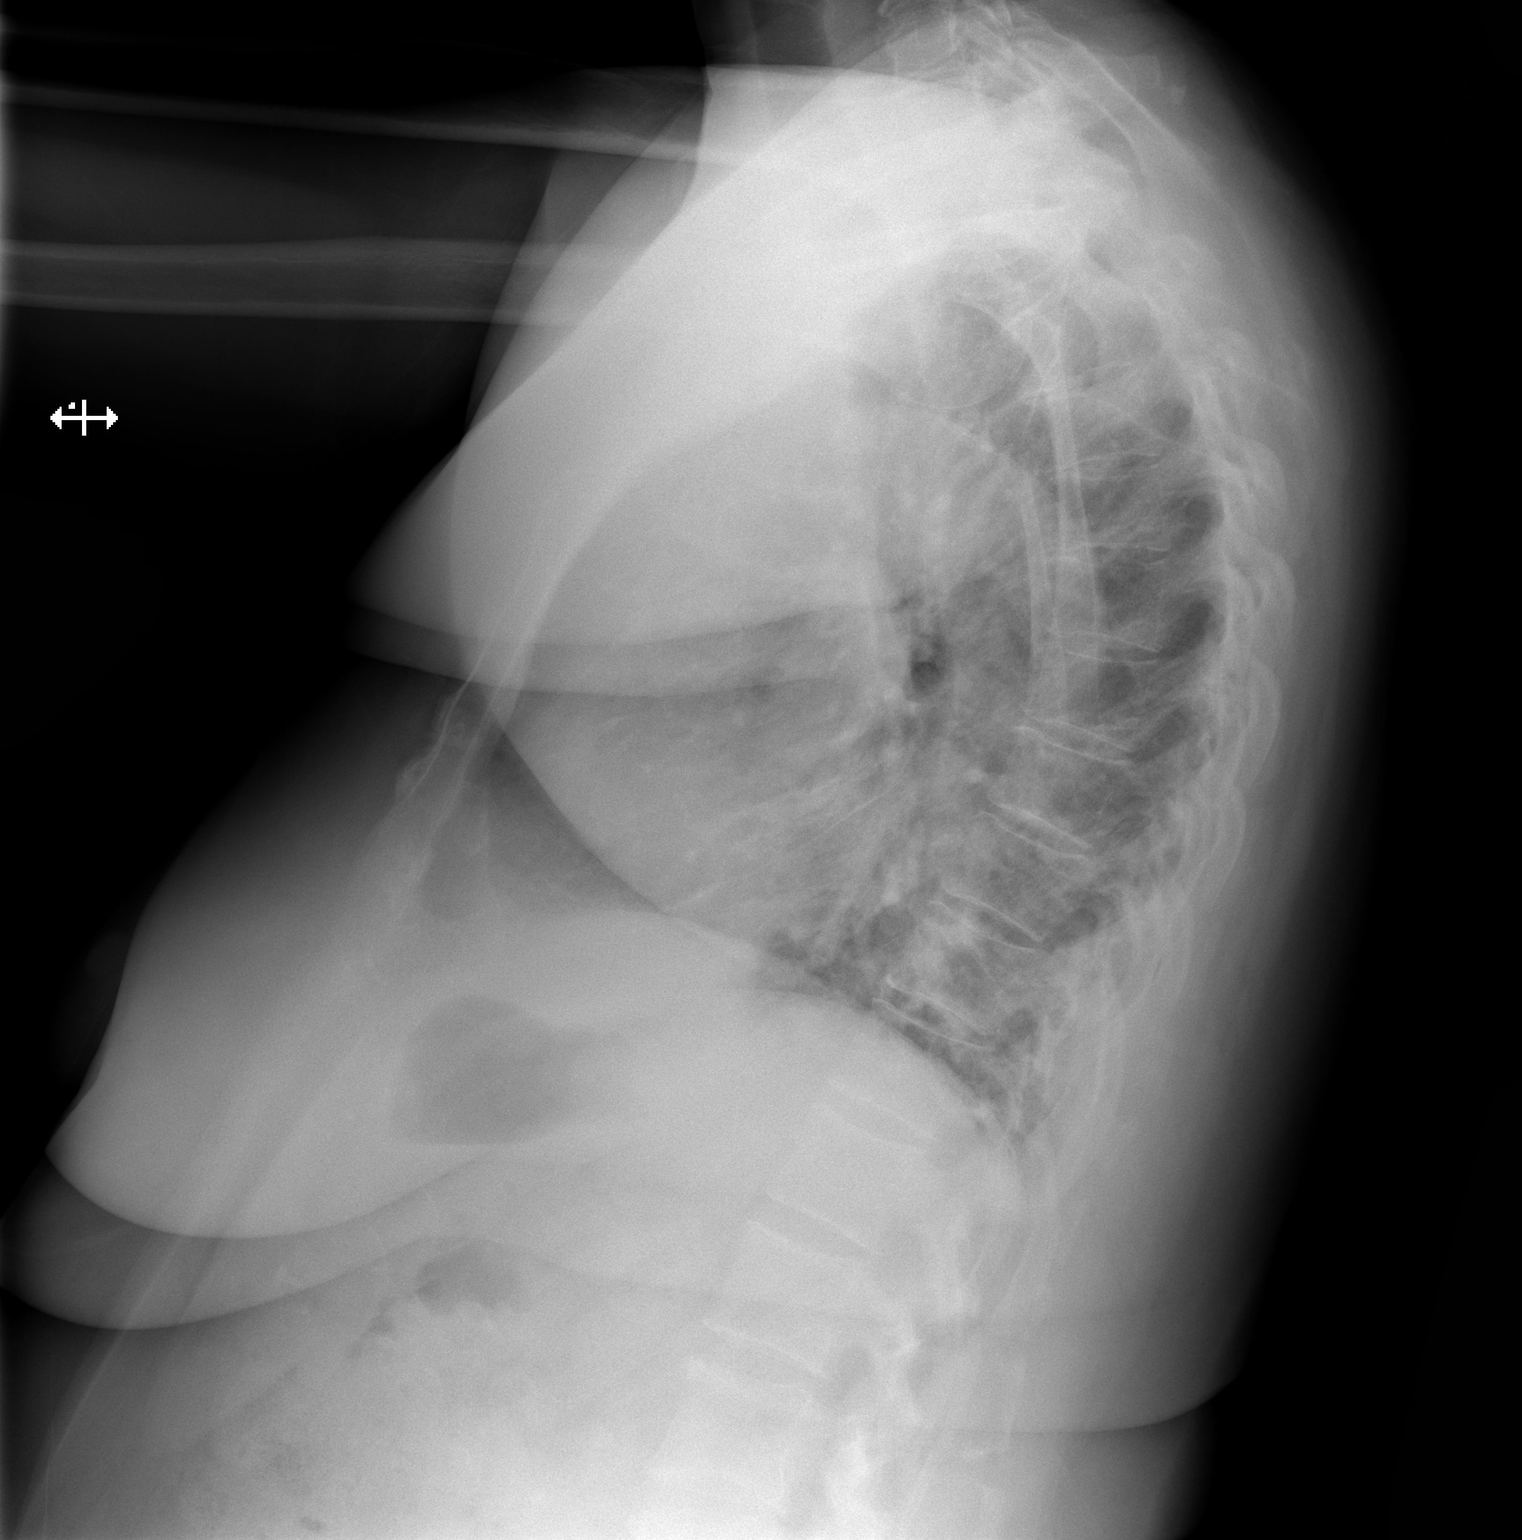

[2 of 2 positions shown; findings below may reference images not displayed]

FINDINGS: There is some minimal patchy opacities in both lung bases. There is
no pleural effusion or pneumothorax. The cardiomediastinal
silhouette is within normal limits. No acute fractures are seen.
IMPRESSION: 1. Minimal bibasilar opacities favored is atelectasis.

## 2022-11-05 DIAGNOSIS — N202 Calculus of kidney with calculus of ureter: Secondary | ICD-10-CM | POA: Diagnosis not present

## 2022-11-23 DIAGNOSIS — R8271 Bacteriuria: Secondary | ICD-10-CM | POA: Diagnosis not present

## 2022-12-02 DIAGNOSIS — N3090 Cystitis, unspecified without hematuria: Secondary | ICD-10-CM | POA: Diagnosis not present

## 2022-12-02 DIAGNOSIS — N3080 Other cystitis without hematuria: Secondary | ICD-10-CM | POA: Diagnosis not present

## 2022-12-02 DIAGNOSIS — N1330 Unspecified hydronephrosis: Secondary | ICD-10-CM | POA: Diagnosis not present

## 2022-12-02 DIAGNOSIS — N210 Calculus in bladder: Secondary | ICD-10-CM | POA: Diagnosis not present

## 2022-12-02 DIAGNOSIS — D494 Neoplasm of unspecified behavior of bladder: Secondary | ICD-10-CM | POA: Diagnosis not present

## 2022-12-10 DIAGNOSIS — N210 Calculus in bladder: Secondary | ICD-10-CM | POA: Diagnosis not present

## 2022-12-10 DIAGNOSIS — N202 Calculus of kidney with calculus of ureter: Secondary | ICD-10-CM | POA: Diagnosis not present

## 2022-12-18 DIAGNOSIS — N210 Calculus in bladder: Secondary | ICD-10-CM | POA: Diagnosis not present

## 2023-01-08 DIAGNOSIS — N3000 Acute cystitis without hematuria: Secondary | ICD-10-CM | POA: Diagnosis not present

## 2023-01-08 DIAGNOSIS — N202 Calculus of kidney with calculus of ureter: Secondary | ICD-10-CM | POA: Diagnosis not present

## 2023-01-08 DIAGNOSIS — N210 Calculus in bladder: Secondary | ICD-10-CM | POA: Diagnosis not present

## 2023-01-08 DIAGNOSIS — R8271 Bacteriuria: Secondary | ICD-10-CM | POA: Diagnosis not present

## 2023-01-27 ENCOUNTER — Ambulatory Visit: Payer: BC Managed Care – PPO | Admitting: Family Medicine

## 2023-01-27 VITALS — BP 122/82 | HR 60 | Temp 98.4°F | Ht 70.0 in | Wt 241.0 lb

## 2023-01-27 DIAGNOSIS — R7303 Prediabetes: Secondary | ICD-10-CM | POA: Diagnosis not present

## 2023-01-27 DIAGNOSIS — R109 Unspecified abdominal pain: Secondary | ICD-10-CM

## 2023-01-27 DIAGNOSIS — E669 Obesity, unspecified: Secondary | ICD-10-CM | POA: Diagnosis not present

## 2023-01-27 DIAGNOSIS — E559 Vitamin D deficiency, unspecified: Secondary | ICD-10-CM | POA: Diagnosis not present

## 2023-01-27 DIAGNOSIS — N1831 Chronic kidney disease, stage 3a: Secondary | ICD-10-CM

## 2023-01-27 DIAGNOSIS — Z2821 Immunization not carried out because of patient refusal: Secondary | ICD-10-CM

## 2023-01-27 LAB — POCT URINALYSIS DIPSTICK
Bilirubin, UA: NEGATIVE
Glucose, UA: NEGATIVE
Ketones, UA: NEGATIVE
Leukocytes, UA: NEGATIVE
Nitrite, UA: NEGATIVE
Protein, UA: NEGATIVE
Spec Grav, UA: 1.015 (ref 1.010–1.025)
Urobilinogen, UA: 0.2 U/dL
pH, UA: 6.5 (ref 5.0–8.0)

## 2023-01-27 NOTE — Progress Notes (Signed)
Carol Gray, CMA,acting as a Neurosurgeon for [name], NP.,have documented all relevant documentation on the behalf of [name], NP,as directed by  [name], NP while in the presence of [name], NP.  Subjective:  Patient ID: Carol Gray , female    DOB: 06/28/1955 , 67 y.o.   MRN: [id number]  Chief Complaint  Patient presents with   Chronic Kidney Disease    HPI  Patient presents today for prediabetes check. She also complains of  cramping in toes and calfs, would like kidney function checked. Has been experiencing kidney stone and had an blasting of kidney stones done. Lower right side back pain near bottom. Tries tylenol and ibuprofen  to help relieve pain. Advised pain to stop NSAID use due to kidney function and also offer to refer to Physical Therapy for back pain but patient declined, she states that she has already been to Orthopedics.  Letter sent to GYN for mammogram     Past Medical History:  Diagnosis Date   Abnormal Pap smear    BV (bacterial vaginosis)    h/o   Hypercholesteremia    PMB (postmenopausal bleeding)    Prediabetes      Family History  Problem Relation Age of Onset   Cancer Mother        uterine   Hypertension Maternal Uncle    Diabetes Maternal Uncle    Diabetes Maternal Aunt    Cancer Maternal Aunt        ovarian   Diabetes Maternal Grandmother    Cancer Father        lung     Current Outpatient Medications:    atorvastatin (LIPITOR) 40 MG tablet, Take 1 tablet (40 mg total) by mouth daily., Disp: 90 tablet, Rfl: 1   VITAMIN D PO, Take 2,000 Int'l Units by mouth daily at 2 am., Disp: , Rfl:    tamsulosin (FLOMAX) 0.4 MG CAPS capsule, Take 1 capsule (0.4 mg total) by mouth daily., Disp: 14 capsule, Rfl: 0   No Known Allergies   Review of Systems  Constitutional: Negative.   HENT: Negative.    Eyes: Negative.   Respiratory: Negative.    Cardiovascular: Negative.   Gastrointestinal: Negative.   Endocrine: Negative for  polydipsia, polyphagia and polyuria.  Genitourinary:  Positive for flank pain.  Musculoskeletal:  Positive for arthralgias and back pain.  Skin: Negative.   Psychiatric/Behavioral:  The patient is nervous/anxious.      Today's Vitals   01/27/23 0901 01/27/23 0928 01/27/23 0954 01/27/23 1025  BP: (!) 150/84 138/80 (!) 152/88 122/82  Pulse: 62   60  Temp: 98.4 F (36.9 C)     Weight: 241 lb (109.3 kg)     Height: 5\' 10"  (1.778 m)      Body mass index is 34.58 kg/m.  Wt Readings from Last 3 Encounters:  01/27/23 241 lb (109.3 kg)  10/07/22 241 lb 12.8 oz (109.7 kg)  03/10/22 243 lb (110.2 kg)    The 10-year ASCVD risk score (Arnett DK, et al., 2019) is: 7%   Values used to calculate the score:     Age: 67 years     Sex: Female     Is Non-Hispanic African American: Yes     Diabetic: No     Tobacco smoker: No     Systolic Blood Pressure: 122 mmHg     Is BP treated: No     HDL Cholesterol: 46 mg/dL     Total  Cholesterol: 204 mg/dL  Objective:  Physical Exam HENT:     Head: Normocephalic.  Cardiovascular:     Rate and Rhythm: Normal rate and regular rhythm.  Pulmonary:     Effort: Pulmonary effort is normal.     Breath sounds: Normal breath sounds.  Abdominal:     General: Bowel sounds are normal.  Skin:    General: Skin is warm and dry.  Neurological:     Mental Status: She is alert.         Assessment And Plan:  Prediabetes Assessment & Plan: Check A1c  Orders: -     Hemoglobin A1c  Vitamin D deficiency Assessment & Plan: Check labs  Orders: -     CBC -     VITAMIN D 25 Hydroxy (Vit-D Deficiency, Fractures) -     CMP14+EGFR  Right flank pain -     POCT urinalysis dipstick  Obesity (BMI 30.0-34.9) Assessment & Plan: Diet and exercise advised    Influenza vaccination declined    Return in 4 months (on 05/29/2023), or if symptoms worsen or fail to improve, for  blood pressure.  Patient was given opportunity to ask questions. Patient  verbalized understanding of the plan and was able to repeat key elements of the plan. All questions were answered to their satisfaction.   I, [name], NP, have reviewed all documentation for this visit. The documentation on 02/03/23 for the exam, diagnosis, procedures, and orders are all accurate and complete.     IF YOU HAVE BEEN REFERRED TO A SPECIALIST, IT MAY TAKE 1-2 WEEKS TO SCHEDULE/PROCESS THE REFERRAL. IF YOU HAVE NOT HEARD FROM US/SPECIALIST IN TWO WEEKS, PLEASE GIVE [place] A CALL AT [phone].

## 2023-01-28 LAB — CMP14+EGFR
ALT: 23 IU/L (ref 0–32)
AST: 25 IU/L (ref 0–40)
Albumin: 4.3 g/dL (ref 3.9–4.9)
Alkaline Phosphatase: 97 IU/L (ref 44–121)
BUN/Creatinine Ratio: 14 (ref 12–28)
BUN: 13 mg/dL (ref 8–27)
Bilirubin Total: 0.8 mg/dL (ref 0.0–1.2)
CO2: 24 mmol/L (ref 20–29)
Calcium: 9.3 mg/dL (ref 8.7–10.3)
Chloride: 101 mmol/L (ref 96–106)
Creatinine, Ser: 0.9 mg/dL (ref 0.57–1.00)
Globulin, Total: 2.9 g/dL (ref 1.5–4.5)
Glucose: 88 mg/dL (ref 70–99)
Potassium: 3.7 mmol/L (ref 3.5–5.2)
Sodium: 139 mmol/L (ref 134–144)
Total Protein: 7.2 g/dL (ref 6.0–8.5)
eGFR: 71 mL/min/{1.73_m2} (ref >59)

## 2023-01-28 LAB — CBC
Hematocrit: 40 % (ref 34.0–46.6)
Hemoglobin: 12.5 g/dL (ref 11.1–15.9)
MCH: 27.9 pg (ref 26.6–33.0)
MCHC: 31.3 g/dL — ABNORMAL LOW (ref 31.5–35.7)
MCV: 89 fL (ref 79–97)
Platelets: 223 10*3/uL (ref 150–450)
RBC: 4.48 x10E6/uL (ref 3.77–5.28)
RDW: 13.1 % (ref 11.7–15.4)
WBC: 3.7 10*3/uL (ref 3.4–10.8)

## 2023-01-28 LAB — HEMOGLOBIN A1C
Est. average glucose Bld gHb Est-mCnc: 143 mg/dL
Hgb A1c MFr Bld: 6.6 % — ABNORMAL HIGH (ref 4.8–5.6)

## 2023-01-28 LAB — VITAMIN D 25 HYDROXY (VIT D DEFICIENCY, FRACTURES): Vit D, 25-Hydroxy: 41 ng/mL (ref 30.0–100.0)

## 2023-02-03 DIAGNOSIS — E559 Vitamin D deficiency, unspecified: Secondary | ICD-10-CM | POA: Insufficient documentation

## 2023-02-03 DIAGNOSIS — R7303 Prediabetes: Secondary | ICD-10-CM | POA: Insufficient documentation

## 2023-02-03 DIAGNOSIS — R109 Unspecified abdominal pain: Secondary | ICD-10-CM | POA: Insufficient documentation

## 2023-02-03 DIAGNOSIS — Z2821 Immunization not carried out because of patient refusal: Secondary | ICD-10-CM | POA: Insufficient documentation

## 2023-02-03 DIAGNOSIS — E669 Obesity, unspecified: Secondary | ICD-10-CM | POA: Insufficient documentation

## 2023-02-03 NOTE — Assessment & Plan Note (Signed)
Diet and exercise advised ?

## 2023-02-03 NOTE — Assessment & Plan Note (Signed)
Check labs 

## 2023-02-03 NOTE — Assessment & Plan Note (Signed)
Check A1c. 

## 2023-04-08 ENCOUNTER — Other Ambulatory Visit: Payer: Self-pay | Admitting: Family Medicine

## 2023-04-08 DIAGNOSIS — E782 Mixed hyperlipidemia: Secondary | ICD-10-CM

## 2023-04-14 ENCOUNTER — Ambulatory Visit: Payer: BC Managed Care – PPO | Admitting: Family Medicine

## 2023-04-22 DIAGNOSIS — Z1231 Encounter for screening mammogram for malignant neoplasm of breast: Secondary | ICD-10-CM | POA: Diagnosis not present

## 2023-04-22 LAB — HM MAMMOGRAPHY

## 2023-06-02 ENCOUNTER — Ambulatory Visit: Payer: BC Managed Care – PPO | Admitting: Family Medicine

## 2023-07-07 ENCOUNTER — Ambulatory Visit: Payer: BC Managed Care – PPO | Admitting: Family Medicine

## 2023-07-14 ENCOUNTER — Encounter: Payer: Self-pay | Admitting: Family Medicine

## 2023-07-14 ENCOUNTER — Ambulatory Visit: Payer: BC Managed Care – PPO | Admitting: Family Medicine

## 2023-07-14 VITALS — BP 116/80 | HR 72 | Temp 98.2°F | Ht 70.0 in | Wt 247.0 lb

## 2023-07-14 DIAGNOSIS — E66812 Obesity, class 2: Secondary | ICD-10-CM

## 2023-07-14 DIAGNOSIS — E1165 Type 2 diabetes mellitus with hyperglycemia: Secondary | ICD-10-CM | POA: Diagnosis not present

## 2023-07-14 DIAGNOSIS — E119 Type 2 diabetes mellitus without complications: Secondary | ICD-10-CM

## 2023-07-14 DIAGNOSIS — R7303 Prediabetes: Secondary | ICD-10-CM | POA: Diagnosis not present

## 2023-07-14 DIAGNOSIS — E782 Mixed hyperlipidemia: Secondary | ICD-10-CM | POA: Diagnosis not present

## 2023-07-14 DIAGNOSIS — Z2821 Immunization not carried out because of patient refusal: Secondary | ICD-10-CM | POA: Insufficient documentation

## 2023-07-14 DIAGNOSIS — Z6835 Body mass index (BMI) 35.0-35.9, adult: Secondary | ICD-10-CM

## 2023-07-14 DIAGNOSIS — E6609 Other obesity due to excess calories: Secondary | ICD-10-CM

## 2023-07-14 DIAGNOSIS — R21 Rash and other nonspecific skin eruption: Secondary | ICD-10-CM | POA: Diagnosis not present

## 2023-07-14 MED ORDER — TRIAMCINOLONE ACETONIDE 0.1 % EX CREA: TOPICAL_CREAM | CUTANEOUS | 0 refills | Status: AC

## 2023-07-14 NOTE — Progress Notes (Addendum)
 I,[name], CMA,acting as a Neurosurgeon for [employer], NP.,have documented all relevant documentation on the behalf of [name], NP,as directed by  [name], NP while in the presence of [name], NP.  Subjective:  Patient ID: Carol Gray , female    DOB: 23-Oct-1955 , 68 y.o.   MRN: [id number]  Chief Complaint  Patient presents with   Prediabetes    HPI  Patient is 68 year old female presents today for prediabetes and cholesterol management. Her last A1c 01/2023 was 6.6, Total cholesterol and LDL were 204 and 140 respectively.She refused treated for both diabetes and hyperlipidemia, stating that she has a long history of family members with diabetes and high cholesterol so she doesn't want to be like them. Patient was advised about the cardiovascular risks, she voiced understanding stating that she is doing what she is supposed to do by eating right and exercising. Patient denies having chest pain, shortness of breath or headaches at this time. Patient reports she has a rash on her face that has been reoccurring for the past year, she has used a cream in the past for it. She declined offer to dermatology, stating she will use the one that sees her son if she needs to.     Past Medical History:  Diagnosis Date   Abnormal Pap smear    BV (bacterial vaginosis)    h/o   Hypercholesteremia    PMB (postmenopausal bleeding)    Prediabetes      Family History  Problem Relation Age of Onset   Cancer Mother        uterine   Hypertension Maternal Uncle    Diabetes Maternal Uncle    Diabetes Maternal Aunt    Cancer Maternal Aunt        ovarian   Diabetes Maternal Grandmother    Cancer Father        lung     Current Outpatient Medications:    atorvastatin (LIPITOR) 40 MG tablet, TAKE 1 TABLET BY MOUTH EVERY DAY, Disp: 90 tablet, Rfl: 1   triamcinolone cream (KENALOG) 0.1 %, APPLY TO AFFECTED AREA ONCE DAILY AS NEEDED, Disp: 45 g, Rfl: 0   VITAMIN D PO, Take 2,000  Int'l Units by mouth daily at 2 am., Disp: , Rfl:    No Known Allergies   Review of Systems  Constitutional: Negative.   Eyes: Negative.   Respiratory: Negative.    Cardiovascular: Negative.   Endocrine: Negative for polydipsia, polyphagia and polyuria.  Genitourinary: Negative.   Musculoskeletal: Negative.   Skin:  Positive for rash.  Neurological: Negative.   Hematological: Negative.   Psychiatric/Behavioral: Negative.       Today's Vitals   07/14/23 0859  BP: 116/80  Pulse: 72  Temp: 98.2 F (36.8 C)  TempSrc: Oral  Weight: 247 lb (112 kg)  Height: 5\' 10"  (1.778 m)  PainSc: 0-No pain   Body mass index is 35.44 kg/m.  Wt Readings from Last 3 Encounters:  07/14/23 247 lb (112 kg)  01/27/23 241 lb (109.3 kg)  10/07/22 241 lb 12.8 oz (109.7 kg)    The 10-year ASCVD risk score (Arnett DK, et al., 2019) is: 10.6%   Values used to calculate the score:     Age: 6 years     Sex: Female     Is Non-Hispanic African American: Yes     Diabetic: Yes     Tobacco smoker: No     Systolic Blood Pressure: 116 mmHg  Is BP treated: No     HDL Cholesterol: 45 mg/dL     Total Cholesterol: 134 mg/dL  Objective:  Physical Exam HENT:     Head: Normocephalic.  Cardiovascular:     Rate and Rhythm: Normal rate and regular rhythm.  Pulmonary:     Effort: Pulmonary effort is normal.     Breath sounds: Normal breath sounds.  Skin:    General: Skin is warm and dry.     Findings: Rash present.  Neurological:     Mental Status: She is alert.         Assessment And Plan:  Mixed hyperlipidemia Assessment & Plan: Wants diet and exercise only.  Orders: -     CBC -     Basic metabolic panel -     Lipid panel  Herpes zoster vaccination declined  Pneumococcal vaccination declined  COVID-19 vaccination declined  Influenza vaccination declined  Rash and nonspecific skin eruption -     Triamcinolone Acetonide; APPLY TO AFFECTED AREA ONCE DAILY AS NEEDED  Dispense: 45 g;  Refill: 0  Class 2 obesity due to excess calories with body mass index (BMI) of 35.0 to 35.9 in adult, unspecified whether serious comorbidity present Assessment & Plan: She is encouraged to strive for BMI less than 30 to decrease cardiac risk. Advised to aim for at least 150 minutes of exercise per week.    Prediabetes Assessment & Plan: Declines any treatment. Diet and exercise only.  Orders: -     Hemoglobin A1c    Return in about 4 months (around 11/11/2023) for physical.  Patient was given opportunity to ask questions. Patient verbalized understanding of the plan and was able to repeat key elements of the plan. All questions were answered to their satisfaction.   I, [name], NP, have reviewed all documentation for this visit. The documentation on 07/27/2023 for the exam, diagnosis, procedures, and orders are all accurate and complete.   IF YOU HAVE BEEN REFERRED TO A SPECIALIST, IT MAY TAKE 1-2 WEEKS TO SCHEDULE/PROCESS THE REFERRAL. IF YOU HAVE NOT HEARD FROM US/SPECIALIST IN TWO WEEKS, PLEASE GIVE [place] A CALL AT [phone].

## 2023-07-15 LAB — BASIC METABOLIC PANEL
BUN/Creatinine Ratio: 17 (ref 12–28)
BUN: 14 mg/dL (ref 8–27)
CO2: 23 mmol/L (ref 20–29)
Calcium: 9.4 mg/dL (ref 8.7–10.3)
Chloride: 107 mmol/L — ABNORMAL HIGH (ref 96–106)
Creatinine, Ser: 0.84 mg/dL (ref 0.57–1.00)
Glucose: 92 mg/dL (ref 70–99)
Potassium: 4 mmol/L (ref 3.5–5.2)
Sodium: 144 mmol/L (ref 134–144)
eGFR: 77 mL/min/{1.73_m2} (ref >59)

## 2023-07-15 LAB — CBC
Hematocrit: 37.8 % (ref 34.0–46.6)
Hemoglobin: 12.5 g/dL (ref 11.1–15.9)
MCH: 29.3 pg (ref 26.6–33.0)
MCHC: 33.1 g/dL (ref 31.5–35.7)
MCV: 89 fL (ref 79–97)
Platelets: 218 10*3/uL (ref 150–450)
RBC: 4.27 x10E6/uL (ref 3.77–5.28)
RDW: 12.9 % (ref 11.7–15.4)
WBC: 4.1 10*3/uL (ref 3.4–10.8)

## 2023-07-15 LAB — HEMOGLOBIN A1C
Est. average glucose Bld gHb Est-mCnc: 134 mg/dL
Hgb A1c MFr Bld: 6.3 % — ABNORMAL HIGH (ref 4.8–5.6)

## 2023-07-15 LAB — LIPID PANEL
Chol/HDL Ratio: 3 ratio (ref 0.0–4.4)
Cholesterol, Total: 134 mg/dL (ref 100–199)
HDL: 45 mg/dL (ref >39)
LDL Chol Calc (NIH): 73 mg/dL (ref 0–99)
Triglycerides: 82 mg/dL (ref 0–149)
VLDL Cholesterol Cal: 16 mg/dL (ref 5–40)

## 2023-07-20 DIAGNOSIS — E119 Type 2 diabetes mellitus without complications: Secondary | ICD-10-CM | POA: Insufficient documentation

## 2023-07-20 NOTE — Assessment & Plan Note (Signed)
 She is encouraged to strive for BMI less than 30 to decrease cardiac risk. Advised to aim for at least 150 minutes of exercise per week.

## 2023-07-20 NOTE — Assessment & Plan Note (Signed)
 Wants diet and exercise only.

## 2023-07-20 NOTE — Assessment & Plan Note (Signed)
 01/27/2023; A1c 6.6, refused medication for diabetes wants diet only.

## 2023-07-27 DIAGNOSIS — E1169 Type 2 diabetes mellitus with other specified complication: Secondary | ICD-10-CM | POA: Insufficient documentation

## 2023-07-27 NOTE — Assessment & Plan Note (Signed)
 Declines any treatment. Diet and exercise only.

## 2023-10-11 ENCOUNTER — Other Ambulatory Visit: Payer: Self-pay | Admitting: Family Medicine

## 2023-10-11 DIAGNOSIS — E782 Mixed hyperlipidemia: Secondary | ICD-10-CM

## 2023-10-27 DIAGNOSIS — Z13820 Encounter for screening for osteoporosis: Secondary | ICD-10-CM | POA: Diagnosis not present

## 2023-10-27 DIAGNOSIS — Z01419 Encounter for gynecological examination (general) (routine) without abnormal findings: Secondary | ICD-10-CM | POA: Diagnosis not present

## 2024-01-12 ENCOUNTER — Encounter: Payer: BC Managed Care – PPO | Admitting: Family Medicine

## 2024-01-20 ENCOUNTER — Ambulatory Visit (INDEPENDENT_AMBULATORY_CARE_PROVIDER_SITE_OTHER): Admitting: Family Medicine

## 2024-01-20 ENCOUNTER — Encounter: Payer: Self-pay | Admitting: Family Medicine

## 2024-01-20 VITALS — BP 120/76 | HR 78 | Temp 98.4°F | Ht 70.0 in | Wt 252.0 lb

## 2024-01-20 DIAGNOSIS — E1169 Type 2 diabetes mellitus with other specified complication: Secondary | ICD-10-CM

## 2024-01-20 DIAGNOSIS — E66812 Obesity, class 2: Secondary | ICD-10-CM

## 2024-01-20 DIAGNOSIS — E785 Hyperlipidemia, unspecified: Secondary | ICD-10-CM

## 2024-01-20 DIAGNOSIS — E782 Mixed hyperlipidemia: Secondary | ICD-10-CM | POA: Diagnosis not present

## 2024-01-20 DIAGNOSIS — E559 Vitamin D deficiency, unspecified: Secondary | ICD-10-CM

## 2024-01-20 DIAGNOSIS — Z0000 Encounter for general adult medical examination without abnormal findings: Secondary | ICD-10-CM | POA: Diagnosis not present

## 2024-01-20 DIAGNOSIS — E1165 Type 2 diabetes mellitus with hyperglycemia: Secondary | ICD-10-CM

## 2024-01-20 DIAGNOSIS — Z2821 Immunization not carried out because of patient refusal: Secondary | ICD-10-CM

## 2024-01-20 DIAGNOSIS — Z6836 Body mass index (BMI) 36.0-36.9, adult: Secondary | ICD-10-CM

## 2024-01-20 LAB — POCT URINALYSIS DIP (CLINITEK)
Bilirubin, UA: NEGATIVE
Blood, UA: NEGATIVE
Glucose, UA: NEGATIVE mg/dL
Ketones, POC UA: NEGATIVE mg/dL
Leukocytes, UA: NEGATIVE
Nitrite, UA: NEGATIVE
POC PROTEIN,UA: NEGATIVE
Spec Grav, UA: 1.01 (ref 1.010–1.025)
Urobilinogen, UA: 0.2 U/dL
pH, UA: 6.5 (ref 5.0–8.0)

## 2024-01-20 NOTE — Patient Instructions (Signed)

## 2024-01-20 NOTE — Progress Notes (Signed)
 I,Carol Gray, CMA,acting as a Neurosurgeon for [employer], NP.,have documented all relevant documentation on the behalf of Carol Creighton, NP,as directed by  Carol Creighton, NP while in the presence of Carol Creighton, NP.  Subjective:    Patient ID: Carol Gray , female    DOB: Jul 24, 1955 , 68 y.o.   MRN: [id number]  Chief Complaint  Patient presents with   Annual Exam    Patient presents today for a physical. Patient reports compliance with her meds. Patient is followed by Dr.Dillard for her GYN care. Patient doesn't have any questions or concerns at this time.    HPI Discussed the use of AI scribe software for clinical note transcription with the patient, who gave verbal consent to proceed.  History of Present Illness      Carol Gray is a 68 year old female who presents for an annual physical exam.  She has a history of diabetes with a previous A1c of 6.5 and is not currently on any diabetes medication. She prefers to manage her condition without medication due to past adverse effects from metformin , including nausea. She is open to considering other medications if necessary. She cannot recall the previous metformin  dose.  She does not regularly monitor her blood pressure at home, having only checked it once in the past year, as she has not experienced any issues. No recent weight loss is noted, and she engages in frequent exercise without significant weight change, possibly gaining some weight instead.  No chest pain or swelling. Bowel movements are regular.  She had a mammogram in December of last year and is not due for another yet. She had an eye exam last year and was advised to return in two years, though she does not recall the name of the eye doctor. She sees a gastroenterologist, Dr. Armond, at [facility].  She takes a multivitamin and calcium  with vitamin D , no longer taking vitamin D  separately. She does not smoke or drink alcohol. She has not yet worked on her  Medicare beyond Part A, as she is still working. She turned 67 last December.  She is encouraged to strive for BMI less than 30 to decrease cardiac risk. Advised to aim for at least 150 minutes of exercise per week.      Past Medical History:  Diagnosis Date   Abnormal Pap smear    BV (bacterial vaginosis)    h/o   Hypercholesteremia    PMB (postmenopausal bleeding)    Prediabetes      Family History  Problem Relation Age of Onset   Cancer Mother        uterine   Hypertension Maternal Uncle    Diabetes Maternal Uncle    Diabetes Maternal Aunt    Cancer Maternal Aunt        ovarian   Diabetes Maternal Grandmother    Cancer Father        lung     Current Outpatient Medications:    atorvastatin  (LIPITOR) 40 MG tablet, TAKE 1 TABLET BY MOUTH EVERY DAY, Disp: 90 tablet, Rfl: 1   triamcinolone  cream (KENALOG ) 0.1 %, APPLY TO AFFECTED AREA ONCE DAILY AS NEEDED, Disp: 45 g, Rfl: 0   VITAMIN D  PO, Take 2,000 Int'l Units by mouth daily at 2 am. (Patient not taking: Reported on 01/20/2024), Disp: , Rfl:    No Known Allergies    Social History   Tobacco Use  Smoking Status Never  Smokeless Tobacco Never  Social History   Substance and Sexual Activity  Alcohol Use No    Review of Systems  Constitutional: Negative.   HENT: Negative.    Eyes: Negative.   Respiratory: Negative.    Cardiovascular: Negative.   Gastrointestinal: Negative.   Endocrine: Negative.   Genitourinary: Negative.   Musculoskeletal: Negative.   Skin: Negative.   Neurological: Negative.   Hematological: Negative.   Psychiatric/Behavioral: Negative.       Today's Vitals   01/20/24 0956  BP: 120/76  Pulse: 78  Temp: 98.4 F (36.9 C)  TempSrc: Oral  Weight: 252 lb (114.3 kg)  Height: 5' 10 (1.778 m)  PainSc: 0-No pain   Body mass index is 36.16 kg/m.  Wt Readings from Last 3 Encounters:  01/20/24 252 lb (114.3 kg)  07/14/23 247 lb (112 kg)  01/27/23 241 lb (109.3 kg)      Objective:  Physical Exam Constitutional:      Appearance: Normal appearance.  HENT:     Head: Normocephalic.  Cardiovascular:     Rate and Rhythm: Normal rate and regular rhythm.     Pulses: Normal pulses.     Heart sounds: Normal heart sounds.  Pulmonary:     Effort: Pulmonary effort is normal.     Breath sounds: Normal breath sounds.  Abdominal:     General: Bowel sounds are normal.  Musculoskeletal:        General: Normal range of motion.  Skin:    General: Skin is warm and dry.  Neurological:     General: No focal deficit present.     Mental Status: She is alert and oriented to person, place, and time. Mental status is at baseline.  Psychiatric:        Mood and Affect: Mood normal.         Assessment And Plan:     Encounter for general adult medical examination w/o abnormal findings  Herpes zoster vaccination declined  COVID-19 vaccination declined  Type 2 diabetes mellitus with hyperglycemia, without long-term current use of insulin (HCC) -     POCT URINALYSIS DIP (CLINITEK) -     Microalbumin / creatinine urine ratio -     EKG 12-Lead -     CBC -     CMP14+EGFR -     Hemoglobin A1c  Hyperlipidemia due to type 2 diabetes mellitus (HCC) -     Lipid panel  Class 2 severe obesity due to excess calories with serious comorbidity and body mass index (BMI) of 36.0 to 36.9 in adult Assessment & Plan: She is encouraged to strive for BMI less than 30 to decrease cardiac risk. Advised to aim for at least 150 minutes of exercise per week.       Return for 1 year physical, 6 months, diabetes. Patient was given opportunity to ask questions. Patient verbalized understanding of the plan and was able to repeat key elements of the plan. All questions were answered to their satisfaction.  I, Carol Creighton, NP, have reviewed all documentation for this visit. The documentation on 02/06/2024 for the exam, diagnosis, procedures, and orders are all accurate and complete.

## 2024-01-21 LAB — CMP14+EGFR
ALT: 25 IU/L (ref 0–32)
AST: 28 IU/L (ref 0–40)
Albumin: 4.3 g/dL (ref 3.9–4.9)
Alkaline Phosphatase: 93 IU/L (ref 44–121)
BUN/Creatinine Ratio: 15 (ref 12–28)
BUN: 14 mg/dL (ref 8–27)
Bilirubin Total: 0.9 mg/dL (ref 0.0–1.2)
CO2: 22 mmol/L (ref 20–29)
Calcium: 9.5 mg/dL (ref 8.7–10.3)
Chloride: 104 mmol/L (ref 96–106)
Creatinine, Ser: 0.91 mg/dL (ref 0.57–1.00)
Globulin, Total: 2.7 g/dL (ref 1.5–4.5)
Glucose: 99 mg/dL (ref 70–99)
Potassium: 4.1 mmol/L (ref 3.5–5.2)
Sodium: 141 mmol/L (ref 134–144)
Total Protein: 7 g/dL (ref 6.0–8.5)
eGFR: 70 mL/min/1.73 (ref >59)

## 2024-01-21 LAB — CBC
Hematocrit: 39.4 % (ref 34.0–46.6)
Hemoglobin: 13.1 g/dL (ref 11.1–15.9)
MCH: 29.7 pg (ref 26.6–33.0)
MCHC: 33.2 g/dL (ref 31.5–35.7)
MCV: 89 fL (ref 79–97)
Platelets: 215 x10E3/uL (ref 150–450)
RBC: 4.41 x10E6/uL (ref 3.77–5.28)
RDW: 13.2 % (ref 11.7–15.4)
WBC: 4.2 x10E3/uL (ref 3.4–10.8)

## 2024-01-21 LAB — LIPID PANEL
Chol/HDL Ratio: 3.3 ratio (ref 0.0–4.4)
Cholesterol, Total: 150 mg/dL (ref 100–199)
HDL: 46 mg/dL (ref >39)
LDL Chol Calc (NIH): 90 mg/dL (ref 0–99)
Triglycerides: 68 mg/dL (ref 0–149)
VLDL Cholesterol Cal: 14 mg/dL (ref 5–40)

## 2024-01-21 LAB — HEMOGLOBIN A1C
Est. average glucose Bld gHb Est-mCnc: 134 mg/dL
Hgb A1c MFr Bld: 6.3 % — ABNORMAL HIGH (ref 4.8–5.6)

## 2024-01-21 LAB — MICROALBUMIN / CREATININE URINE RATIO
Creatinine, Urine: 27.5 mg/dL
Microalb/Creat Ratio: <11 mg/g{creat} (ref 0–29)
Microalbumin, Urine: <3 ug/mL

## 2024-01-31 DIAGNOSIS — Z2821 Immunization not carried out because of patient refusal: Secondary | ICD-10-CM | POA: Insufficient documentation

## 2024-02-09 ENCOUNTER — Ambulatory Visit: Payer: Self-pay | Admitting: Family Medicine

## 2024-02-09 DIAGNOSIS — E66812 Obesity, class 2: Secondary | ICD-10-CM | POA: Insufficient documentation

## 2024-02-09 DIAGNOSIS — E1169 Type 2 diabetes mellitus with other specified complication: Secondary | ICD-10-CM | POA: Insufficient documentation

## 2024-02-09 DIAGNOSIS — Z0000 Encounter for general adult medical examination without abnormal findings: Secondary | ICD-10-CM | POA: Insufficient documentation

## 2024-02-09 NOTE — Progress Notes (Signed)
 Labs are stable. A1c is 6.3, kidney function is normal. Cholesterol levels have improved, keep taking your atorvastatin  daily.  Thank you!

## 2024-02-09 NOTE — Assessment & Plan Note (Signed)
 She is encouraged to strive for BMI less than 30 to decrease cardiac risk. Advised to aim for at least 150 minutes of exercise per week.

## 2024-03-21 DIAGNOSIS — L218 Other seborrheic dermatitis: Secondary | ICD-10-CM | POA: Diagnosis not present

## 2024-03-21 DIAGNOSIS — L710 Perioral dermatitis: Secondary | ICD-10-CM | POA: Diagnosis not present

## 2024-03-21 DIAGNOSIS — L720 Epidermal cyst: Secondary | ICD-10-CM | POA: Diagnosis not present

## 2024-03-21 DIAGNOSIS — L718 Other rosacea: Secondary | ICD-10-CM | POA: Diagnosis not present

## 2024-04-17 ENCOUNTER — Other Ambulatory Visit: Payer: Self-pay | Admitting: Family Medicine

## 2024-04-17 DIAGNOSIS — E782 Mixed hyperlipidemia: Secondary | ICD-10-CM

## 2024-04-29 DIAGNOSIS — Z1231 Encounter for screening mammogram for malignant neoplasm of breast: Secondary | ICD-10-CM | POA: Diagnosis not present

## 2024-04-29 DIAGNOSIS — Z13820 Encounter for screening for osteoporosis: Secondary | ICD-10-CM | POA: Diagnosis not present

## 2024-07-19 ENCOUNTER — Ambulatory Visit: Payer: Self-pay | Admitting: Family Medicine

## 2025-01-24 ENCOUNTER — Encounter: Payer: Self-pay | Admitting: Family Medicine
